# Patient Record
Sex: Male | Born: 1985 | Race: White | Hispanic: No | Marital: Single | Smoking: Former smoker
Health system: Southern US, Community
[De-identification: ages and names within clinical notes are randomized; demographics above are authoritative.]

## PROBLEM LIST (undated history)

## (undated) DIAGNOSIS — F329 Major depressive disorder, single episode, unspecified: Secondary | ICD-10-CM

## (undated) DIAGNOSIS — F419 Anxiety disorder, unspecified: Secondary | ICD-10-CM

## (undated) DIAGNOSIS — K219 Gastro-esophageal reflux disease without esophagitis: Secondary | ICD-10-CM

## (undated) DIAGNOSIS — E7800 Pure hypercholesterolemia, unspecified: Secondary | ICD-10-CM

## (undated) DIAGNOSIS — F410 Panic disorder [episodic paroxysmal anxiety] without agoraphobia: Secondary | ICD-10-CM

## (undated) DIAGNOSIS — F1020 Alcohol dependence, uncomplicated: Secondary | ICD-10-CM

## (undated) DIAGNOSIS — F32A Depression, unspecified: Secondary | ICD-10-CM

## (undated) DIAGNOSIS — I10 Essential (primary) hypertension: Secondary | ICD-10-CM

## (undated) DIAGNOSIS — S42309A Unspecified fracture of shaft of humerus, unspecified arm, initial encounter for closed fracture: Secondary | ICD-10-CM

## (undated) HISTORY — DX: Essential (primary) hypertension: I10

## (undated) HISTORY — PX: WISDOM TOOTH EXTRACTION: SHX21

## (undated) HISTORY — DX: Pure hypercholesterolemia, unspecified: E78.00

## (undated) HISTORY — DX: Gastro-esophageal reflux disease without esophagitis: K21.9

---
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
## 2009-05-12 ENCOUNTER — Encounter: Admission: RE | Admit: 2009-05-12 | Discharge: 2009-05-12 | Payer: Self-pay | Admitting: Family Medicine

## 2012-12-31 ENCOUNTER — Other Ambulatory Visit: Payer: Self-pay | Admitting: Gastroenterology

## 2013-01-07 ENCOUNTER — Ambulatory Visit
Admission: RE | Admit: 2013-01-07 | Discharge: 2013-01-07 | Disposition: A | Discharge status: Home / Self Care | Payer: BC Managed Care – PPO | Source: Ambulatory Visit | Attending: Gastroenterology | Admitting: Gastroenterology

## 2013-01-07 DIAGNOSIS — R110 Nausea: Secondary | ICD-10-CM

## 2013-05-26 ENCOUNTER — Encounter (HOSPITAL_COMMUNITY): Payer: Self-pay | Admitting: Emergency Medicine

## 2013-05-26 ENCOUNTER — Emergency Department (INDEPENDENT_AMBULATORY_CARE_PROVIDER_SITE_OTHER)
Admission: EM | Admit: 2013-05-26 | Discharge: 2013-05-26 | Disposition: A | Discharge status: Home / Self Care | Payer: BC Managed Care – PPO | Source: Home / Self Care

## 2013-05-26 DIAGNOSIS — T24001A Burn of unspecified degree of unspecified site of right lower limb, except ankle and foot, initial encounter: Secondary | ICD-10-CM

## 2013-05-26 DIAGNOSIS — T24009A Burn of unspecified degree of unspecified site of unspecified lower limb, except ankle and foot, initial encounter: Secondary | ICD-10-CM

## 2013-05-26 MED ORDER — CEPHALEXIN 500 MG PO CAPS: 1000.0000 mg | ORAL_CAPSULE | Freq: Two times a day (BID) | ORAL | Status: DC

## 2013-05-26 MED ORDER — SILVER SULFADIAZINE 1 % EX CREA: TOPICAL_CREAM | Freq: Two times a day (BID) | CUTANEOUS | Status: DC

## 2013-05-26 NOTE — ED Provider Notes (Signed)
Medical screening examination/treatment/procedure(s) were performed by resident physician or non-physician practitioner and as supervising physician I was immediately available for consultation/collaboration.   Barkley Bruns MD.   Linna Hoff, MD 05/26/13 2027
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
## 2014-03-12 ENCOUNTER — Encounter (HOSPITAL_COMMUNITY): Payer: Self-pay | Admitting: Emergency Medicine

## 2014-03-12 ENCOUNTER — Emergency Department (HOSPITAL_COMMUNITY)
Admission: EM | Admit: 2014-03-12 | Discharge: 2014-03-12 | Disposition: A | Discharge status: Home / Self Care | Payer: BC Managed Care – PPO | Attending: Emergency Medicine | Admitting: Emergency Medicine

## 2014-03-12 DIAGNOSIS — F419 Anxiety disorder, unspecified: Secondary | ICD-10-CM | POA: Diagnosis present

## 2014-03-12 DIAGNOSIS — Z79899 Other long term (current) drug therapy: Secondary | ICD-10-CM | POA: Insufficient documentation

## 2014-03-12 DIAGNOSIS — F17200 Nicotine dependence, unspecified, uncomplicated: Secondary | ICD-10-CM | POA: Insufficient documentation

## 2014-03-12 DIAGNOSIS — F411 Generalized anxiety disorder: Secondary | ICD-10-CM

## 2014-03-12 DIAGNOSIS — F3289 Other specified depressive episodes: Secondary | ICD-10-CM | POA: Insufficient documentation

## 2014-03-12 DIAGNOSIS — R110 Nausea: Secondary | ICD-10-CM | POA: Insufficient documentation

## 2014-03-12 DIAGNOSIS — F064 Anxiety disorder due to known physiological condition: Secondary | ICD-10-CM | POA: Diagnosis present

## 2014-03-12 DIAGNOSIS — F1010 Alcohol abuse, uncomplicated: Secondary | ICD-10-CM

## 2014-03-12 DIAGNOSIS — F329 Major depressive disorder, single episode, unspecified: Secondary | ICD-10-CM | POA: Insufficient documentation

## 2014-03-12 DIAGNOSIS — Z8781 Personal history of (healed) traumatic fracture: Secondary | ICD-10-CM | POA: Insufficient documentation

## 2014-03-12 DIAGNOSIS — F22 Delusional disorders: Secondary | ICD-10-CM | POA: Insufficient documentation

## 2014-03-12 HISTORY — DX: Panic disorder (episodic paroxysmal anxiety): F41.0

## 2014-03-12 HISTORY — DX: Depression, unspecified: F32.A

## 2014-03-12 HISTORY — DX: Anxiety disorder, unspecified: F41.9

## 2014-03-12 HISTORY — DX: Major depressive disorder, single episode, unspecified: F32.9

## 2014-03-12 HISTORY — DX: Unspecified fracture of shaft of humerus, unspecified arm, initial encounter for closed fracture: S42.309A

## 2014-03-12 LAB — CBC WITH DIFFERENTIAL/PLATELET
Basophils Absolute: 0 10*3/uL (ref 0.0–0.1)
Basophils Relative: 1 % (ref 0–1)
EOS ABS: 0 10*3/uL (ref 0.0–0.7)
Eosinophils Relative: 0 % (ref 0–5)
HCT: 51.2 % (ref 39.0–52.0)
HEMOGLOBIN: 17.9 g/dL — AB (ref 13.0–17.0)
LYMPHS ABS: 2 10*3/uL (ref 0.7–4.0)
LYMPHS PCT: 33 % (ref 12–46)
MCH: 31.6 pg (ref 26.0–34.0)
MCHC: 35 g/dL (ref 30.0–36.0)
MCV: 90.3 fL (ref 78.0–100.0)
MONOS PCT: 7 % (ref 3–12)
Monocytes Absolute: 0.4 10*3/uL (ref 0.1–1.0)
NEUTROS ABS: 3.5 10*3/uL (ref 1.7–7.7)
NEUTROS PCT: 59 % (ref 43–77)
PLATELETS: 294 10*3/uL (ref 150–400)
RBC: 5.67 MIL/uL (ref 4.22–5.81)
RDW: 12.4 % (ref 11.5–15.5)
WBC: 5.9 10*3/uL (ref 4.0–10.5)

## 2014-03-12 LAB — COMPREHENSIVE METABOLIC PANEL
ALK PHOS: 100 U/L (ref 39–117)
ALT: 61 U/L — AB (ref 0–53)
AST: 52 U/L — ABNORMAL HIGH (ref 0–37)
Albumin: 4.8 g/dL (ref 3.5–5.2)
BILIRUBIN TOTAL: 0.9 mg/dL (ref 0.3–1.2)
BUN: 12 mg/dL (ref 6–23)
CHLORIDE: 96 meq/L (ref 96–112)
CO2: 30 mEq/L (ref 19–32)
Calcium: 9.8 mg/dL (ref 8.4–10.5)
Creatinine, Ser: 1 mg/dL (ref 0.50–1.35)
GFR calc non Af Amer: >90 mL/min (ref >90)
GLUCOSE: 122 mg/dL — AB (ref 70–99)
POTASSIUM: 3.9 meq/L (ref 3.7–5.3)
SODIUM: 142 meq/L (ref 137–147)
TOTAL PROTEIN: 8.5 g/dL — AB (ref 6.0–8.3)

## 2014-03-12 LAB — RAPID URINE DRUG SCREEN, HOSP PERFORMED
AMPHETAMINES: NOT DETECTED
BARBITURATES: NOT DETECTED
BENZODIAZEPINES: NOT DETECTED
Cocaine: NOT DETECTED
Opiates: NOT DETECTED
Tetrahydrocannabinol: NOT DETECTED

## 2014-03-12 LAB — ETHANOL: ALCOHOL ETHYL (B): 101 mg/dL — AB (ref 0–11)

## 2014-03-12 MED ORDER — VITAMIN B-1 100 MG PO TABS: 100.0000 mg | ORAL_TABLET | Freq: Every day | ORAL | Status: DC

## 2014-03-12 MED ORDER — NICOTINE 21 MG/24HR TD PT24: 21.0000 mg | MEDICATED_PATCH | Freq: Every day | TRANSDERMAL | Status: DC

## 2014-03-12 MED ORDER — ONDANSETRON HCL 4 MG PO TABS: 4.0000 mg | ORAL_TABLET | Freq: Three times a day (TID) | ORAL | Status: DC | PRN

## 2014-03-12 MED ORDER — LORAZEPAM 1 MG PO TABS: 0.0000 mg | ORAL_TABLET | Freq: Four times a day (QID) | ORAL | Status: DC

## 2014-03-12 MED ORDER — LORAZEPAM 2 MG/ML IJ SOLN: 0.0000 mg | Freq: Two times a day (BID) | INTRAMUSCULAR | Status: DC

## 2014-03-12 MED ORDER — LORAZEPAM 1 MG PO TABS: 1.0000 mg | ORAL_TABLET | Freq: Three times a day (TID) | ORAL | Status: DC | PRN

## 2014-03-12 MED ORDER — ALUM & MAG HYDROXIDE-SIMETH 200-200-20 MG/5ML PO SUSP: 30.0000 mL | ORAL | Status: DC | PRN

## 2014-03-12 MED ORDER — ZOLPIDEM TARTRATE 5 MG PO TABS: 5.0000 mg | ORAL_TABLET | Freq: Every evening | ORAL | Status: DC | PRN

## 2014-03-12 MED ORDER — IBUPROFEN 200 MG PO TABS: 600.0000 mg | ORAL_TABLET | Freq: Three times a day (TID) | ORAL | Status: DC | PRN

## 2014-03-12 MED ORDER — THIAMINE HCL 100 MG/ML IJ SOLN: 100.0000 mg | Freq: Every day | INTRAMUSCULAR | Status: DC

## 2014-03-12 MED ORDER — ACETAMINOPHEN 325 MG PO TABS: 650.0000 mg | ORAL_TABLET | ORAL | Status: DC | PRN

## 2014-03-12 MED ORDER — LORAZEPAM 2 MG/ML IJ SOLN: 0.0000 mg | Freq: Four times a day (QID) | INTRAMUSCULAR | Status: DC

## 2014-03-12 MED ORDER — LORAZEPAM 1 MG PO TABS: 0.0000 mg | ORAL_TABLET | Freq: Two times a day (BID) | ORAL | Status: DC

## 2014-03-12 NOTE — ED Notes (Signed)
Pt requesting detox of ETOH. Per pt's mother he has some mental issues going on, bc he is paranoid all the time, laying out of work, feels like he is going to die.  Pt drinks about 12pk or more beers per day for at least two years.  Pt taking Effexor but sometimes wont take it.  Recently had dosage increased but pt's mother states that pt wont take bc he is afraid to.

## 2014-03-12 NOTE — Progress Notes (Signed)
  CARE MANAGEMENT ED NOTE 03/12/2014  Patient:  Edward Singh,Edward Singh   Account Number:  [id number]  Date Initiated:  03/12/2014  Documentation initiated by:  Edd ArbourGIBBS,Paulanthony Gleaves  Subjective/Objective Assessment:   28 yr old bcbs [place] ppo pt with change of pcp per pt and [facility]     Subjective/Objective Assessment Detail:   c/o detox of ETOH. Per pt's mother he has some mental issues going on, bc he is paranoid all the time, laying out of work, feels like he is going to die.  Pt drinks about 12pk or more beers per day for at least two years.  Pt taking Effexor but sometimes wont take it.     Action/Plan:   spoke with  at pcp office who confirmed pcp is now seen in [place] office Pt now sees brittany hout, PA   Action/Plan Detail:   EPIc updated   Anticipated DC Date:       Status Recommendation to Physician:   Result of Recommendation:    Other ED Services  Consult Working Plan    DC Planning Services  Other  PCP issues  Outpatient Services - Pt will follow up    Choice offered to / List presented to:            Status of service:  Completed, signed off  ED Comments:   ED Comments Detail:

## 2014-03-12 NOTE — Discharge Instructions (Signed)
Panic Attacks  Panic attacks are sudden, short-lived surges of severe anxiety, fear, or discomfort. They may occur for no reason when you are relaxed, when you are anxious, or when you are sleeping. Panic attacks may occur for a number of reasons:   · Healthy people occasionally have panic attacks in extreme, life-threatening situations, such as war or natural disasters. Normal anxiety is a protective mechanism of the body that helps us react to danger (fight or flight response).  · Panic attacks are often seen with anxiety disorders, such as panic disorder, social anxiety disorder, generalized anxiety disorder, and phobias. Anxiety disorders cause excessive or uncontrollable anxiety. They may interfere with your relationships or other life activities.  · Panic attacks are sometimes seen with other mental illnesses such as depression and posttraumatic stress disorder.  · Certain medical conditions, prescription medicines, and drugs of abuse can cause panic attacks.  SYMPTOMS   Panic attacks start suddenly, peak within 20 minutes, and are accompanied by four or more of the following symptoms:  · Pounding heart or fast heart rate (palpitations).  · Sweating.  · Trembling or shaking.  · Shortness of breath or feeling smothered.  · Feeling choked.  · Chest pain or discomfort.  · Nausea or strange feeling in your stomach.  · Dizziness, lightheadedness, or feeling like you will faint.  · Chills or hot flushes.  · Numbness or tingling in your lips or hands and feet.  · Feeling that things are not real or feeling that you are not yourself.  · Fear of losing control or going crazy.  · Fear of dying.  Some of these symptoms can mimic serious medical conditions. For example, you may think you are having a heart attack. Although panic attacks can be very scary, they are not life threatening.  DIAGNOSIS   Panic attacks are diagnosed through an assessment by your health care provider. Your health care provider will ask questions  about your symptoms, such as where and when they occurred. Your health care provider will also ask about your medical history and use of alcohol and drugs, including prescription medicines. Your health care provider may order blood tests or other studies to rule out a serious medical condition. Your health care provider may refer you to a mental health professional for further evaluation.  TREATMENT   · Most healthy people who have one or two panic attacks in an extreme, life-threatening situation will not require treatment.  · The treatment for panic attacks associated with anxiety disorders or other mental illness typically involves counseling with a mental health professional, medicine, or a combination of both. Your health care provider will help determine what treatment is best for you.  · Panic attacks due to physical illness usually goes away with treatment of the illness. If prescription medicine is causing panic attacks, talk with your health care provider about stopping the medicine, decreasing the dose, or substituting another medicine.  · Panic attacks due to alcohol or drug abuse goes away with abstinence. Some adults need professional help in order to stop drinking or using drugs.  HOME CARE INSTRUCTIONS   · Take all your medicines as prescribed.    · Check with your health care provider before starting new prescription or over-the-counter medicines.  · Keep all follow up appointments with your health care provider.  SEEK MEDICAL CARE IF:  · You are not able to take your medicines as prescribed.  · Your symptoms do not improve or get worse.  SEEK IMMEDIATE   MEDICAL CARE IF:   · You experience panic attack symptoms that are different than your usual symptoms.  · You have serious thoughts about hurting yourself or others.  · You are taking medicine for panic attacks and have a serious side effect.  MAKE SURE YOU:  · Understand these instructions.  · Will watch your condition.  · Will get help right away  if you are not doing well or get worse.  Document Released: 10/23/2005 Document Revised: 08/13/2013 Document Reviewed: 06/06/2013  ExitCare® Patient Information ©2014 [employer].
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
## 2014-03-12 NOTE — Progress Notes (Signed)
PA, Lorelle FormosaHanna cancelled consult because the patient is not medically cleared.

## 2014-03-12 NOTE — ED Notes (Signed)
Pt has two personal belongings bags, pt's family states they will take them home.
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
## 2014-03-12 NOTE — ED Notes (Signed)
Bed: [facility] Expected date:  Expected time:  Means of arrival:  Comments: Pt still in room

## 2014-03-16 ENCOUNTER — Emergency Department (HOSPITAL_COMMUNITY)
Admission: EM | Admit: 2014-03-16 | Discharge: 2014-03-16 | Disposition: A | Discharge status: Other Acute Inpatient Hospital | Payer: BC Managed Care – PPO | Attending: Psychiatry | Admitting: Psychiatry

## 2014-03-16 ENCOUNTER — Inpatient Hospital Stay (HOSPITAL_COMMUNITY)
Admission: AD | Admit: 2014-03-16 | Discharge: 2014-03-19 | DRG: 897 | Disposition: A | Discharge status: Home / Self Care | Payer: BC Managed Care – PPO | Source: Intra-hospital | Attending: Psychiatry | Admitting: Psychiatry

## 2014-03-16 ENCOUNTER — Encounter (HOSPITAL_COMMUNITY): Payer: Self-pay | Admitting: Emergency Medicine

## 2014-03-16 ENCOUNTER — Encounter (HOSPITAL_COMMUNITY): Payer: Self-pay

## 2014-03-16 DIAGNOSIS — F1994 Other psychoactive substance use, unspecified with psychoactive substance-induced mood disorder: Secondary | ICD-10-CM | POA: Diagnosis present

## 2014-03-16 DIAGNOSIS — F3289 Other specified depressive episodes: Secondary | ICD-10-CM | POA: Insufficient documentation

## 2014-03-16 DIAGNOSIS — F329 Major depressive disorder, single episode, unspecified: Secondary | ICD-10-CM | POA: Insufficient documentation

## 2014-03-16 DIAGNOSIS — F320 Major depressive disorder, single episode, mild: Secondary | ICD-10-CM | POA: Diagnosis present

## 2014-03-16 DIAGNOSIS — F1020 Alcohol dependence, uncomplicated: Secondary | ICD-10-CM

## 2014-03-16 DIAGNOSIS — F411 Generalized anxiety disorder: Secondary | ICD-10-CM | POA: Diagnosis present

## 2014-03-16 DIAGNOSIS — F410 Panic disorder [episodic paroxysmal anxiety] without agoraphobia: Secondary | ICD-10-CM | POA: Diagnosis present

## 2014-03-16 DIAGNOSIS — Z87891 Personal history of nicotine dependence: Secondary | ICD-10-CM

## 2014-03-16 DIAGNOSIS — F419 Anxiety disorder, unspecified: Secondary | ICD-10-CM

## 2014-03-16 DIAGNOSIS — R109 Unspecified abdominal pain: Secondary | ICD-10-CM | POA: Insufficient documentation

## 2014-03-16 DIAGNOSIS — G4700 Insomnia, unspecified: Secondary | ICD-10-CM | POA: Diagnosis present

## 2014-03-16 DIAGNOSIS — Z8781 Personal history of (healed) traumatic fracture: Secondary | ICD-10-CM | POA: Insufficient documentation

## 2014-03-16 DIAGNOSIS — Z79899 Other long term (current) drug therapy: Secondary | ICD-10-CM | POA: Insufficient documentation

## 2014-03-16 HISTORY — DX: Alcohol dependence, uncomplicated: F10.20

## 2014-03-16 LAB — COMPREHENSIVE METABOLIC PANEL
ALBUMIN: 4.7 g/dL (ref 3.5–5.2)
ALT: 60 U/L — ABNORMAL HIGH (ref 0–53)
AST: 63 U/L — ABNORMAL HIGH (ref 0–37)
Alkaline Phosphatase: 99 U/L (ref 39–117)
BUN: 14 mg/dL (ref 6–23)
CO2: 26 mEq/L (ref 19–32)
Calcium: 9.3 mg/dL (ref 8.4–10.5)
Chloride: 97 mEq/L (ref 96–112)
Creatinine, Ser: 0.96 mg/dL (ref 0.50–1.35)
GFR calc Af Amer: >90 mL/min (ref >90)
GFR calc non Af Amer: >90 mL/min (ref >90)
Glucose, Bld: 126 mg/dL — ABNORMAL HIGH (ref 70–99)
Potassium: 4 mEq/L (ref 3.7–5.3)
Sodium: 140 mEq/L (ref 137–147)
TOTAL PROTEIN: 8.2 g/dL (ref 6.0–8.3)
Total Bilirubin: 1.5 mg/dL — ABNORMAL HIGH (ref 0.3–1.2)

## 2014-03-16 LAB — URINALYSIS, ROUTINE W REFLEX MICROSCOPIC
Bilirubin Urine: NEGATIVE
GLUCOSE, UA: NEGATIVE mg/dL
Hgb urine dipstick: NEGATIVE
Ketones, ur: NEGATIVE mg/dL
LEUKOCYTES UA: NEGATIVE
NITRITE: NEGATIVE
PH: 8 (ref 5.0–8.0)
PROTEIN: 100 mg/dL — AB
Specific Gravity, Urine: 1.035 — ABNORMAL HIGH (ref 1.005–1.030)
Urobilinogen, UA: 1 mg/dL (ref 0.0–1.0)

## 2014-03-16 LAB — CBC WITH DIFFERENTIAL/PLATELET
BASOS PCT: 1 % (ref 0–1)
Basophils Absolute: 0 10*3/uL (ref 0.0–0.1)
EOS PCT: 0 % (ref 0–5)
Eosinophils Absolute: 0 10*3/uL (ref 0.0–0.7)
HCT: 48.6 % (ref 39.0–52.0)
HEMOGLOBIN: 17.3 g/dL — AB (ref 13.0–17.0)
Lymphocytes Relative: 40 % (ref 12–46)
Lymphs Abs: 1.7 10*3/uL (ref 0.7–4.0)
MCH: 31.1 pg (ref 26.0–34.0)
MCHC: 35.6 g/dL (ref 30.0–36.0)
MCV: 87.3 fL (ref 78.0–100.0)
MONO ABS: 0.3 10*3/uL (ref 0.1–1.0)
Monocytes Relative: 6 % (ref 3–12)
Neutro Abs: 2.3 10*3/uL (ref 1.7–7.7)
Neutrophils Relative %: 53 % (ref 43–77)
Platelets: 239 10*3/uL (ref 150–400)
RBC: 5.57 MIL/uL (ref 4.22–5.81)
RDW: 12.1 % (ref 11.5–15.5)
WBC: 4.4 10*3/uL (ref 4.0–10.5)

## 2014-03-16 LAB — ETHANOL: Alcohol, Ethyl (B): 115 mg/dL — ABNORMAL HIGH (ref 0–11)

## 2014-03-16 LAB — URINE MICROSCOPIC-ADD ON

## 2014-03-16 MED ORDER — PANTOPRAZOLE SODIUM 40 MG PO TBEC: 80.0000 mg | DELAYED_RELEASE_TABLET | Freq: Two times a day (BID) | ORAL | Status: DC

## 2014-03-16 MED ORDER — MAGNESIUM HYDROXIDE 400 MG/5ML PO SUSP
30.0000 mL | Freq: Every day | ORAL | Status: DC | PRN
  Administered 2014-03-18: 30 mL via ORAL

## 2014-03-16 MED ORDER — ALUM & MAG HYDROXIDE-SIMETH 200-200-20 MG/5ML PO SUSP: 30.0000 mL | ORAL | Status: DC | PRN

## 2014-03-16 MED ORDER — CHLORDIAZEPOXIDE HCL 25 MG PO CAPS
25.0000 mg | ORAL_CAPSULE | Freq: Four times a day (QID) | ORAL | Status: DC | PRN
  Administered 2014-03-16: 25 mg via ORAL
  Filled 2014-03-16: qty 1

## 2014-03-16 MED ORDER — ONDANSETRON 4 MG PO TBDP: 4.0000 mg | ORAL_TABLET | Freq: Four times a day (QID) | ORAL | Status: DC | PRN

## 2014-03-16 MED ORDER — HYDROXYZINE HCL 25 MG PO TABS
25.0000 mg | ORAL_TABLET | Freq: Four times a day (QID) | ORAL | Status: DC | PRN
  Administered 2014-03-16: 25 mg via ORAL
  Filled 2014-03-16: qty 1

## 2014-03-16 MED ORDER — LOPERAMIDE HCL 2 MG PO CAPS: 2.0000 mg | ORAL_CAPSULE | ORAL | Status: DC | PRN

## 2014-03-16 MED ORDER — LORAZEPAM 1 MG PO TABS: 2.0000 mg | ORAL_TABLET | Freq: Once | ORAL | Status: DC

## 2014-03-16 MED ORDER — THIAMINE HCL 100 MG/ML IJ SOLN: 100.0000 mg | Freq: Once | INTRAMUSCULAR | Status: DC

## 2014-03-16 MED ORDER — LORAZEPAM 1 MG PO TABS: 0.0000 mg | ORAL_TABLET | Freq: Four times a day (QID) | ORAL | Status: DC

## 2014-03-16 MED ORDER — CHLORDIAZEPOXIDE HCL 25 MG PO CAPS
25.0000 mg | ORAL_CAPSULE | Freq: Three times a day (TID) | ORAL | Status: AC
  Administered 2014-03-18 – 2014-03-19 (×3): 25 mg via ORAL
  Filled 2014-03-16 (×3): qty 1

## 2014-03-16 MED ORDER — SODIUM CHLORIDE 0.9 % IV BOLUS (SEPSIS)
1000.0000 mL | INTRAVENOUS | Status: AC
  Administered 2014-03-16: 1000 mL via INTRAVENOUS

## 2014-03-16 MED ORDER — LORAZEPAM 1 MG PO TABS
1.0000 mg | ORAL_TABLET | Freq: Once | ORAL | Status: AC
  Administered 2014-03-16: 1 mg via ORAL
  Filled 2014-03-16: qty 1

## 2014-03-16 MED ORDER — LOPERAMIDE HCL 2 MG PO CAPS
2.0000 mg | ORAL_CAPSULE | ORAL | Status: DC | PRN
  Administered 2014-03-17: 4 mg via ORAL
  Filled 2014-03-16: qty 2

## 2014-03-16 MED ORDER — ONDANSETRON 4 MG PO TBDP
4.0000 mg | ORAL_TABLET | Freq: Once | ORAL | Status: AC
  Administered 2014-03-16: 4 mg via ORAL
  Filled 2014-03-16: qty 1

## 2014-03-16 MED ORDER — VITAMIN B-1 100 MG PO TABS: 100.0000 mg | ORAL_TABLET | Freq: Every day | ORAL | Status: DC

## 2014-03-16 MED ORDER — LORAZEPAM 1 MG PO TABS: 1.0000 mg | ORAL_TABLET | Freq: Four times a day (QID) | ORAL | Status: DC | PRN

## 2014-03-16 MED ORDER — VITAMIN B-1 100 MG PO TABS
100.0000 mg | ORAL_TABLET | Freq: Every day | ORAL | Status: DC
  Filled 2014-03-16: qty 1

## 2014-03-16 MED ORDER — ACETAMINOPHEN 325 MG PO TABS
650.0000 mg | ORAL_TABLET | Freq: Four times a day (QID) | ORAL | Status: DC | PRN
  Administered 2014-03-18: 650 mg via ORAL
  Filled 2014-03-16: qty 2

## 2014-03-16 MED ORDER — ADULT MULTIVITAMIN W/MINERALS CH
1.0000 | ORAL_TABLET | Freq: Every day | ORAL | Status: DC
  Administered 2014-03-17 – 2014-03-19 (×3): 1 via ORAL
  Filled 2014-03-16 (×4): qty 1

## 2014-03-16 MED ORDER — CHLORDIAZEPOXIDE HCL 25 MG PO CAPS: 25.0000 mg | ORAL_CAPSULE | Freq: Once | ORAL | Status: DC

## 2014-03-16 MED ORDER — CHLORDIAZEPOXIDE HCL 25 MG PO CAPS: 25.0000 mg | ORAL_CAPSULE | Freq: Every day | ORAL | Status: DC

## 2014-03-16 MED ORDER — THIAMINE HCL 100 MG/ML IJ SOLN: 100.0000 mg | Freq: Every day | INTRAMUSCULAR | Status: DC

## 2014-03-16 MED ORDER — TRAZODONE HCL 100 MG PO TABS
100.0000 mg | ORAL_TABLET | Freq: Every evening | ORAL | Status: DC | PRN
  Administered 2014-03-16 – 2014-03-18 (×3): 100 mg via ORAL
  Filled 2014-03-16: qty 4
  Filled 2014-03-16 (×3): qty 1

## 2014-03-16 MED ORDER — FOLIC ACID 1 MG PO TABS: 1.0000 mg | ORAL_TABLET | Freq: Every day | ORAL | Status: DC

## 2014-03-16 MED ORDER — THIAMINE HCL 100 MG/ML IJ SOLN
100.0000 mg | Freq: Once | INTRAMUSCULAR | Status: AC
  Administered 2014-03-16: 100 mg via INTRAVENOUS
  Filled 2014-03-16: qty 2

## 2014-03-16 MED ORDER — ADULT MULTIVITAMIN W/MINERALS CH
1.0000 | ORAL_TABLET | Freq: Every day | ORAL | Status: DC
  Administered 2014-03-16: 1 via ORAL
  Filled 2014-03-16 (×2): qty 1

## 2014-03-16 MED ORDER — CHLORDIAZEPOXIDE HCL 25 MG PO CAPS
25.0000 mg | ORAL_CAPSULE | Freq: Four times a day (QID) | ORAL | Status: AC
  Administered 2014-03-16 – 2014-03-18 (×6): 25 mg via ORAL
  Filled 2014-03-16 (×6): qty 1

## 2014-03-16 MED ORDER — VENLAFAXINE HCL ER 37.5 MG PO CP24
37.5000 mg | ORAL_CAPSULE | Freq: Every day | ORAL | Status: DC
  Administered 2014-03-17: 37.5 mg via ORAL
  Filled 2014-03-16 (×2): qty 1

## 2014-03-16 MED ORDER — HYDROXYZINE HCL 25 MG PO TABS
25.0000 mg | ORAL_TABLET | Freq: Four times a day (QID) | ORAL | Status: DC | PRN
  Administered 2014-03-17: 25 mg via ORAL
  Filled 2014-03-16: qty 1

## 2014-03-16 MED ORDER — ADULT MULTIVITAMIN W/MINERALS CH: 1.0000 | ORAL_TABLET | Freq: Every day | ORAL | Status: DC

## 2014-03-16 MED ORDER — CHLORDIAZEPOXIDE HCL 25 MG PO CAPS: 25.0000 mg | ORAL_CAPSULE | ORAL | Status: DC

## 2014-03-16 MED ORDER — VITAMIN B-1 100 MG PO TABS
100.0000 mg | ORAL_TABLET | Freq: Every day | ORAL | Status: DC
  Administered 2014-03-17 – 2014-03-19 (×3): 100 mg via ORAL
  Filled 2014-03-16 (×4): qty 1

## 2014-03-16 MED ORDER — LORAZEPAM 2 MG/ML IJ SOLN: 1.0000 mg | Freq: Four times a day (QID) | INTRAMUSCULAR | Status: DC | PRN

## 2014-03-16 MED ORDER — PANTOPRAZOLE SODIUM 40 MG PO TBEC
80.0000 mg | DELAYED_RELEASE_TABLET | Freq: Two times a day (BID) | ORAL | Status: DC
  Administered 2014-03-17 – 2014-03-19 (×5): 80 mg via ORAL
  Filled 2014-03-16 (×9): qty 2

## 2014-03-16 MED ORDER — VENLAFAXINE HCL ER 37.5 MG PO CP24
37.5000 mg | ORAL_CAPSULE | Freq: Every day | ORAL | Status: DC
  Filled 2014-03-16: qty 1

## 2014-03-16 MED ORDER — LORAZEPAM 1 MG PO TABS: 0.0000 mg | ORAL_TABLET | Freq: Two times a day (BID) | ORAL | Status: DC

## 2014-03-16 NOTE — Progress Notes (Signed)
  CARE MANAGEMENT ED NOTE 03/16/2014  Patient:  Edward Singh,Edward Singh   Account Number:  [id number]  Date Initiated:  03/16/2014  Documentation initiated by:  Edd ArbourGIBBS,KIMBERLY  Subjective/Objective Assessment:   28 yr old [place] pt Hx of anxiety & daily drinking for 4 years last seen in [facility] on 03/12/14 and was scheduled to be seen by outpt psych provider at d/c, 2 ED visits in last 6 months no admission     Subjective/Objective Assessment Detail:   CM consult for help with establishing outpatient rehab for alcohol    Pcp Tanna FurryBrittany Hout     Action/Plan:   CM spoke with ED US. CM spoke with TTS staff Paige. TTS order entered in EPIC   Action/Plan Detail:   CM spoke with pt who confirms access to psychologist at the end of May 2015 CM reviewed available etoh rehab centers covered by [employer] per [employer] web site with pt's father Discuss how to obtain this information via [employer] web site & customer #   Anticipated DC Date:       Status Recommendation to Physician:   Result of Recommendation:    Other ED Services  Consult Working Plan    DC Planning Services  Other  Outpatient Services - Pt will follow up    Choice offered to / List presented to:            Status of service:  Completed, signed off  ED Comments:   ED Comments Detail:  ED CM reviewed the following and provided written information to pt and his father at the bedside-plus other centers in [place]- Discussed ED BH unit inability to control bed availability at these facilities  [facility] [address], [place], [place] [address] [phone]  [facility] [address], [place], [place] [address] [phone]  [facility] [address], [place], [place] [phone]  [facility] [address], [place], [place] [address] [phone]  [facility] [address], [place], [place] [address] [phone]
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
## 2014-03-16 NOTE — ED Notes (Signed)
TTS at bedside. 

## 2014-03-16 NOTE — ED Notes (Signed)
[facility]

## 2014-03-16 NOTE — ED Notes (Signed)
Per family and pt.  Hx of anxiety and daily drinking for 4 years.  Pt drinks daily at approx anywhere from 6-7 up to 14.  Pt also c/o abdominal pain starting today.  Pt also seen Thursday for same.  Pt has been vomiting today.  Last drink last night.
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
## 2014-03-16 NOTE — Tx Team (Signed)
Initial Interdisciplinary Treatment Plan  PATIENT STRENGTHS: (choose at least two) General fund of knowledge Supportive family/friends  PATIENT STRESSORS: Substance abuse   PROBLEM LIST: Problem List/Patient Goals Date to be addressed Date deferred Reason deferred Estimated date of resolution  SA 03/16/14     Anxiety 03/16/14                                                DISCHARGE CRITERIA:  Improved stabilization in mood, thinking, and/or behavior  PRELIMINARY DISCHARGE PLAN: Attend PHP/IOP Outpatient therapy  PATIENT/FAMIILY INVOLVEMENT: This treatment plan has been presented to and reviewed with the patient, Edward Singh.  The patient and family have been given the opportunity to ask questions and make suggestions.  Delos HaringMichael A Aylissa Singh 03/16/2014, 7:25 PM

## 2014-03-17 DIAGNOSIS — F411 Generalized anxiety disorder: Secondary | ICD-10-CM

## 2014-03-17 DIAGNOSIS — F1994 Other psychoactive substance use, unspecified with psychoactive substance-induced mood disorder: Secondary | ICD-10-CM

## 2014-03-17 DIAGNOSIS — F410 Panic disorder [episodic paroxysmal anxiety] without agoraphobia: Secondary | ICD-10-CM

## 2014-03-17 MED ORDER — LISINOPRIL 10 MG PO TABS
10.0000 mg | ORAL_TABLET | Freq: Every day | ORAL | Status: DC
  Administered 2014-03-18 – 2014-03-19 (×2): 10 mg via ORAL
  Filled 2014-03-17: qty 1
  Filled 2014-03-17: qty 2
  Filled 2014-03-17 (×2): qty 1

## 2014-03-17 MED ORDER — LISINOPRIL 20 MG PO TABS
20.0000 mg | ORAL_TABLET | Freq: Once | ORAL | Status: AC
  Administered 2014-03-17: 20 mg via ORAL
  Filled 2014-03-17 (×2): qty 1

## 2014-03-17 NOTE — [facility] Suicide Risk Assessment (Signed)
Suicide Risk Assessment  Admission Assessment     Nursing information obtained from:    Demographic factors:    Current Mental Status:    Loss Factors:    Historical Factors:    Risk Reduction Factors:    Total Time spent with patient: 45 minutes  CLINICAL FACTORS:   Alcohol/Substance Abuse/Dependencies COGNITIVE FEATURES THAT CONTRIBUTE TO RISK:  Closed-mindedness Polarized thinking Thought constriction (tunnel vision)    SUICIDE RISK:   Moderate:   PLAN OF CARE: Supportive approach/coping skills/relapse prevention                               [facility]rium Detox/reassess and address the co morbidities                               D/C Effexor  I certify that inpatient services furnished can reasonably be expected to improve the patient's condition.  Rachael Feerving A Sione Baumgarten 03/17/2014, 5:00 PM

## 2014-03-17 NOTE — Progress Notes (Signed)
Patient ID: Edward ShapeMichael A Singh, male   DOB: 12-Mar-1986, 28 y.o.   MRN: [id number] D: Pt is awake and active on the unit this AM. Pt denies SI/HI and A/V hallucinations. Pt mood is appropriate and his affect is flat. Writer provided literature regarding recovery and encouraged pt and family to explore personal experience as it relates to addiction.   A: Encouraged pt to express needs with staff and administered medication per MD orders. Writer also encouraged pt to participate in groups.  R: Pt and family are receptive to staff guidance and input. Writer will continue to monitor. 15 minute checks are ongoing for safety.

## 2014-03-17 NOTE — ED Provider Notes (Signed)
Medical screening examination/treatment/procedure(s) were performed by non-physician practitioner and as supervising physician I was immediately available for consultation/collaboration.   EKG Interpretation None       Zamara Cozad R. Claudy Abdallah, MD 03/17/14 0031 

## 2014-03-17 NOTE — Progress Notes (Signed)
Patient ID: Edward Singh, male   DOB: 04-04-1986, 28 y.o.   MRN: [id number] He has been up and about and to groups interacting with peers. He denies thoughts of wanting to harm himself. Denies being depressed and is not hopeless.

## 2014-03-17 NOTE — H&P (Signed)
Psychiatric Admission Assessment Adult  Patient Identification:  Edward Singh Date of Evaluation:  03/17/2014 Chief Complaint:  ALCOHOL ABUSE ANXIETY DISORDER NOS History of Present Illness:: 28 Y/o male who states he has been drinking, 6-7 beers daily, when working and up to 16 when not working. Drinking heavily since 23 Smoked a little bit of weed in HS. His drinking is affecting his ability to function. Report no energy no motivation to do anything. He is concerned about his job. When he has tried to stop admits to sweats, nausea, vomiting tremors. Does admit to anxiety, panic. He has been treated with Effexor for years. States he experiences "vertigo" light head ness. He has not taken it every day and recently it was increased to 75 mg. Might have been more foggy headed since then  Associated Signs/Synptoms: Depression Symptoms:  depressed mood, anhedonia, fatigue, anxiety, panic attacks, loss of energy/fatigue, (Hypo) Manic Symptoms:  Denies Anxiety Symptoms:  Excessive Worry, Panic Symptoms, Social Anxiety, Psychotic Symptoms:  Denies PTSD Symptoms: Negative Total Time spent with patient: 45 minutes  Psychiatric Specialty Exam: Physical Exam  Review of Systems  Constitutional: Positive for malaise/fatigue.  HENT: Negative.   Eyes: Negative.   Respiratory: Negative.   Cardiovascular: Positive for palpitations.  Gastrointestinal: Positive for heartburn and nausea.  Genitourinary: Negative.   Musculoskeletal: Negative.   Skin: Negative.   Neurological: Positive for dizziness and weakness.  Endo/Heme/Allergies: Negative.   Psychiatric/Behavioral: Positive for substance abuse. The patient is nervous/anxious and has insomnia.     Blood pressure 150/116, pulse 114, temperature 98 F (36.7 C), temperature source Oral, resp. rate 16, height 5' 9.25" (1.759 m), weight 76.204 kg (168 lb).Body mass index is 24.63 kg/(m^2).  General Appearance: Fairly Groomed  Engineer, water::   Poor  Speech:  Clear and Coherent, Slow and not spontanoues  Volume:  Decreased  Mood:  Anxious and Depressed  Affect:  anxious, worried  Thought Process:  Coherent and Goal Directed  Orientation:  Full (Time, Place, and Person)  Thought Content:  symtpoms, worries, concerns  Suicidal Thoughts:  No  Homicidal Thoughts:  No  Memory:  Immediate;   Fair Recent;   Fair Remote;   Fair  Judgement:  Fair  Insight:  Present  Psychomotor Activity:  Restlessness  Concentration:  Fair  Recall:  [employer] of Knowledge:NA  Language: Fair  Akathisia:  No  Handed:    AIMS (if indicated):     Assets:  Desire for [place]  Sleep:       Musculoskeletal: Strength & Muscle Tone: within normal limits Gait & Station: normal Patient leans: N/A  Past Psychiatric History: Diagnosis:  Hospitalizations: Denies  Outpatient Care: has seen a counselor at church for his drinking  Substance Abuse Care: Denies  Self-Mutilation: denies  Suicidal Attempts:Denies  Violent Behaviors:Denies   Past Medical History:   Past Medical History  Diagnosis Date  . Broken arm   . Panic attacks   . Anxiety   . Depression   . Alcoholism     Allergies:  No Known Allergies PTA Medications: Prescriptions prior to admission  Medication Sig Dispense Refill  . dexlansoprazole (DEXILANT) 60 MG capsule Take 60 mg by mouth every morning.       . venlafaxine XR (EFFEXOR-XR) 37.5 MG 24 hr capsule Take 37.5 mg by mouth daily with breakfast.        Previous Psychotropic Medications:  Medication/Dose    Effexor XR 37. 5 mg increased to 75  mg             Substance Abuse History in the last 12 months:  yes  Consequences of Substance Abuse: Withdrawal Symptoms:   Diaphoresis Diarrhea Nausea Tremors  Social History:  reports that he has quit smoking. His smokeless tobacco use includes Chew. He reports that he drinks alcohol. He reports that he does not  use illicit drugs. 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 Legal History: speeding tickets Hobbies/Interests:  Family History:  History reviewed. No pertinent family history.  Results for orders placed during the hospital encounter of 03/16/14 (from the past 72 hour(s))  CBC WITH DIFFERENTIAL     Status: Abnormal   Collection Time    03/16/14 12:25 PM      Result Value Ref Range   WBC 4.4  4.0 - 10.5 K/uL   RBC 5.57  4.22 - 5.81 MIL/uL   Hemoglobin 17.3 (*) 13.0 - 17.0 g/dL   HCT 48.6  39.0 - 52.0 %   MCV 87.3  78.0 - 100.0 fL   MCH 31.1  26.0 - 34.0 pg   MCHC 35.6  30.0 - 36.0 g/dL   RDW 12.1  11.5 - 15.5 %   Platelets 239  150 - 400 K/uL   Neutrophils Relative % 53  43 - 77 %   Neutro Abs 2.3  1.7 - 7.7 K/uL   Lymphocytes Relative 40  12 - 46 %   Lymphs Abs 1.7  0.7 - 4.0 K/uL   Monocytes Relative 6  3 - 12 %   Monocytes Absolute 0.3  0.1 - 1.0 K/uL   Eosinophils Relative 0  0 - 5 %   Eosinophils Absolute 0.0  0.0 - 0.7 K/uL   Basophils Relative 1  0 - 1 %   Basophils Absolute 0.0  0.0 - 0.1 K/uL  COMPREHENSIVE METABOLIC PANEL     Status: Abnormal   Collection Time    03/16/14 12:25 PM      Result Value Ref Range   Sodium 140  137 - 147 mEq/L   Potassium 4.0  3.7 - 5.3 mEq/L   Chloride 97  96 - 112 mEq/L   CO2 26  19 - 32 mEq/L   Glucose, Bld 126 (*) 70 - 99 mg/dL   BUN 14  6 - 23 mg/dL   Creatinine, Ser 0.96  0.50 - 1.35 mg/dL   Calcium 9.3  8.4 - 10.5 mg/dL   Total Protein 8.2  6.0 - 8.3 g/dL   Albumin 4.7  3.5 - 5.2 g/dL    AST 63 (*) 0 - 37 U/L   ALT 60 (*) 0 - 53 U/L   Alkaline Phosphatase 99  39 - 117 U/L   Total Bilirubin 1.5 (*) 0.3 - 1.2 mg/dL   GFR calc non Af Amer >90  >90 mL/min   GFR calc Af Amer >90  >90 mL/min   Comment: (NOTE)     The eGFR has been calculated using the CKD EPI equation.     This calculation has not been validated in  all clinical situations.     eGFR's persistently <90 mL/min signify possible Chronic Kidney     Disease.  ETHANOL     Status: Abnormal   Collection Time    03/16/14 12:25 PM      Result Value Ref Range   Alcohol, Ethyl (B) 115 (*) 0 - 11 mg/dL   Comment:            LOWEST DETECTABLE LIMIT FOR     SERUM ALCOHOL IS 11 mg/dL     FOR MEDICAL PURPOSES ONLY  URINALYSIS, ROUTINE W REFLEX MICROSCOPIC     Status: Abnormal   Collection Time    03/16/14 12:46 PM      Result Value Ref Range   Color, Urine AMBER (*) YELLOW   Comment: BIOCHEMICALS MAY BE AFFECTED BY COLOR   APPearance CLOUDY (*) CLEAR   Specific Gravity, Urine 1.035 (*) 1.005 - 1.030   pH 8.0  5.0 - 8.0   Glucose, UA NEGATIVE  NEGATIVE mg/dL   Hgb urine dipstick NEGATIVE  NEGATIVE   Bilirubin Urine NEGATIVE  NEGATIVE   Ketones, ur NEGATIVE  NEGATIVE mg/dL   Protein, ur 100 (*) NEGATIVE mg/dL   Urobilinogen, UA 1.0  0.0 - 1.0 mg/dL   Nitrite NEGATIVE  NEGATIVE   Leukocytes, UA NEGATIVE  NEGATIVE  URINE MICROSCOPIC-ADD ON     Status: None   Collection Time    03/16/14 12:46 PM      Result Value Ref Range   Urine-Other MUCOUS PRESENT     Comment: AMORPHOUS URATES/PHOSPHATES   Psychological Evaluations:  Assessment:   DSM5:  Schizophrenia Disorders:  none Obsessive-Compulsive Disorders:  none Trauma-Stressor Disorders:  none Substance/Addictive Disorders:  Alcohol Related Disorder - Severe (303.90) Depressive Disorders:  Major Depressive Disorder - Moderate (296.22)  AXIS I:  Generalized Anxiety Disorder, Panic attacks Substance Induced Mood Disorder AXIS II:  No diagnosis AXIS III:    Past Medical History  Diagnosis Date  . Broken arm   . Panic attacks   . Anxiety   . Depression   . Alcoholism    AXIS IV:  other psychosocial or environmental problems AXIS V:  51-60 moderate symptoms  Treatment Plan/Recommendations:  Supportive approach/coping skills/relapse prevention                                                                 Librium detox protocol                                                                 Reassess and address the co morbidities                                                                 D/C the Effexor  Monitor BP                                                                   Treatment Plan Summary: Daily contact with patient to assess and evaluate symptoms and progress in treatment Medication management Current Medications:  Current Facility-Administered Medications  Medication Dose Route Frequency Provider Last Rate Last Dose  . acetaminophen (TYLENOL) tablet 650 mg  650 mg Oral Q6H PRN Waylan Boga, NP      . alum & mag hydroxide-simeth (MAALOX/MYLANTA) 200-200-20 MG/5ML suspension 30 mL  30 mL Oral Q4H PRN Waylan Boga, NP      . chlordiazePOXIDE (LIBRIUM) capsule 25 mg  25 mg Oral Q6H PRN Kathlee Nations, MD   25 mg at 03/16/14 2019  . chlordiazePOXIDE (LIBRIUM) capsule 25 mg  25 mg Oral QID Kathlee Nations, MD   25 mg at 03/17/14 0755   Followed by  . [START ON 03/18/2014] chlordiazePOXIDE (LIBRIUM) capsule 25 mg  25 mg Oral TID Kathlee Nations, MD       Followed by  . [START ON 03/19/2014] chlordiazePOXIDE (LIBRIUM) capsule 25 mg  25 mg Oral BH-qamhs Kathlee Nations, MD       Followed by  . [START ON 03/21/2014] chlordiazePOXIDE (LIBRIUM) capsule 25 mg  25 mg Oral Daily Kathlee Nations, MD      . hydrOXYzine (ATARAX/VISTARIL) tablet 25 mg  25 mg Oral Q6H PRN Kathlee Nations, MD   25 mg at 03/17/14 4008  . [START ON 03/18/2014] lisinopril (PRINIVIL,ZESTRIL) tablet 10 mg  10  mg Oral Daily Encarnacion Slates, NP      . loperamide (IMODIUM) capsule 2-4 mg  2-4 mg Oral PRN Kathlee Nations, MD      . magnesium hydroxide (MILK OF MAGNESIA) suspension 30 mL  30 mL Oral Daily PRN Waylan Boga, NP      . multivitamin with minerals tablet 1 tablet  1 tablet Oral Daily Kathlee Nations, MD   1 tablet at 03/17/14 0755  . ondansetron (ZOFRAN-ODT) disintegrating tablet 4 mg  4 mg Oral Q6H PRN Kathlee Nations, MD      . pantoprazole (PROTONIX) EC tablet 80 mg  80 mg Oral BID AC Waylan Boga, NP   80 mg at 03/17/14 6761  . thiamine (B-1) injection 100 mg  100 mg Intramuscular Once Kathlee Nations, MD      . thiamine (VITAMIN B-1) tablet 100 mg  100 mg Oral Daily Kathlee Nations, MD   100 mg at 03/17/14 0755  . traZODone (DESYREL) tablet 100 mg  100 mg Oral QHS PRN Waylan Boga, NP   100 mg at 03/16/14 2146  . venlafaxine XR (EFFEXOR-XR) 24 hr capsule 37.5 mg  37.5 mg Oral Q breakfast Waylan Boga, NP   37.5 mg at 03/17/14 0755    Observation Level/Precautions:  15 minute checks  Laboratory:  As per the ED  Psychotherapy:  Individual/group  Medications:  Librium Detox protocol/D/C the Effexor  Consultations:    Discharge Concerns:    Estimated LOS: 3-5 days  Other:     I certify that inpatient services furnished can reasonably be expected to improve the patient's condition.   Nicholaus Bloom 5/12/20158:42 AM

## 2014-03-17 NOTE — Progress Notes (Addendum)
Pt attended spiritual care group on grief and loss facilitated by chaplain Burnis KingfisherMatthew Novalie Singh.  Group opened with brief discussion and psycho-social ed around grief and loss in relationships and in relation to self - identifying life patterns, circumstances, changes that cause losses. Established group norm of speaking from own life experience. Group goal of establishing open and affirming space for members to share loss and experience with grief, normalize grief experience and provide psycho social education and grief support.    Edward Singh was present throughout group.  He was not vocal.  He was somewhat restless in group, though appeared to be attentive to others as they shared.    Edward MealyMatthew W Charron Coultas MDiv

## 2014-03-17 NOTE — Consult Note (Signed)
Face to face evaluation and I agree with this note 

## 2014-03-17 NOTE — [facility] Suicide Risk Assessment (Signed)
[facility] INPATIENT: Family/Significant Other Suicide Prevention Education  Suicide Prevention Education:  Education Completed; No one has been identified by the patient as the family member/significant other with whom the patient will be residing, and identified as the person(s) who will aid the patient in the event of a mental health crisis (suicidal ideations/suicide attempt).   Pt did not c/o SI at admission, nor have they endorsed SI during their stay here. SPE not required. SPI pamphlet provided to pt and he was encouraged to share information with support network, ask questions, and talk about any concerns relating to SPE.   The Sherwin-WilliamsHeather Smart, LCSWA 03/17/2014 9:39 AM

## 2014-03-17 NOTE — [facility] Group Notes (Signed)
[facility] LCSW Group Therapy  03/17/2014 3:19 PM  Type of Therapy:  Group Therapy  Participation Level:  Active  Participation Quality:  Attentive  Affect:  Appropriate  Cognitive:  Alert and Oriented  Insight:  Engaged  Engagement in Therapy:  Engaged  Modes of Intervention:  Confrontation, Discussion, Education, Exploration, Rapport Building, Dance movement psychotherapisteality Testing, Socialization and Support  Summary of Progress/Problems: [facility] Speaker came to talk about his personal journey with substance abuse and addiction. The pt processed ways by which to relate to the speaker. MHA speaker provided handouts and educational information pertaining to groups and services offered by the [facility]. Edward Singh was attentive and engaged throughout today's therapy group. He actively listened as speaker shared his personal story and reviewed resources [facility] by [facility].    Edward Singh LCSWA  03/17/2014, 3:19 PM

## 2014-03-17 NOTE — [facility] Group Notes (Signed)
[facility] Group Notes:  (Nursing/MHT/Case Management/Adjunct)  Date:  03/17/2014  Time: 0900 am  Type of Therapy:  Psychoeducational Skills  Participation Level:  Did Not Attend   Cranford MonCaroline Evans Laraine Samet 03/17/2014, 5:57 PM

## 2014-03-17 NOTE — [facility] Counselor (Signed)
Adult Comprehensive Assessment  Patient ID: Edward Singh, male   DOB: 04-Aug-1986, 28 y.o.   MRN: [id number]  Information Source: Information source: Patient  Current Stressors:  Physical health (include injuries & life threatening diseases): prehypertension Bereavement / Loss: none identified   Living/Environment/Situation:  Living Arrangements: Spouse/significant other;Parent Living conditions (as described by patient or guardian): I live in a house with my parents and girlfriend. supportive, clean and safe How long has patient lived in current situation?: few months  What is atmosphere in current home: Comfortable;Loving;Supportive  Family History:  Marital status: Single (dating for 3 /12 years ) Does patient have children?: No  Childhood History:  By whom was/is the patient raised?: Both parents Additional childhood history information: My parents are married. They raised me when I was younger.  Description of patient's relationship with caregiver when they were a child: Close to both parents as a child. I had a great childhood Patient's description of current relationship with people who raised him/her: I'm still really close to my parents.  Does patient have siblings?: Yes Number of Siblings: 1 Description of patient's current relationship with siblings: Good relationship with younger sister. We are a tight knit family.  Did patient suffer any verbal/emotional/physical/sexual abuse as a child?: No Did patient suffer from severe childhood neglect?: No Has patient ever been sexually abused/assaulted/raped as an adolescent or adult?: No Was the patient ever a victim of a crime or a disaster?: No Witnessed domestic violence?: No Has patient been effected by domestic violence as an adult?: No  Education:  Highest grade of school patient has completed: 2 year degree college. 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  How long has patient been employed?: 7 years  Patient's job has been impacted by current illness: Yes Describe how patient's job has been impacted: Drinking and feeling hungover in the morning. Sometimes I was late or missed work.  What is the longest time patient has a held a job?: 7 years Where was the patient employed at that time?: see above.  Has patient ever been in the Eli Lilly and Companymilitary?: No Has patient ever served in combat?: No  Financial Resources:   Financial resources: Income from [employer] Insuranceemployment;Private insurance Does patient have a representative payee or guardian?: No  Alcohol/Substance Abuse:   What has been your use of drugs/alcohol within the last 12 months?: I drink alot of beer/liquor on weekends. I drink about 14 beers daily. No drug use identified. I've been drinking for about four years.  If attempted suicide, did drugs/alcohol play a role in this?: No Alcohol/Substance Abuse Treatment Hx: Past Tx, Outpatient If yes, describe treatment: PCP prescribes my mental health meds. I've never been to detox or tx before.  Has alcohol/substance abuse ever caused legal problems?: No  Social Support System:   Patient's Community Support System: Good Describe Community Support System: I have a good group of friends. They drink with me sometimes but don't have a problem i don't think.  Type of faith/religion: Ephriam KnucklesChristian How does patient's faith help to cope with current illness?: I go to church and have a good church Manufacturing systems engineercommunity  Leisure/Recreation:   Leisure and Hobbies: hunting and fishing.   Strengths/Needs:   What things does the patient do well?: hunt, fish, hard worker, good person In what areas does patient struggle /  problems for patient: struggling with drinking habits/coping with stuff.   Discharge Plan:   Does  patient have access to transportation?: Yes (license and car) Will patient be returning to same living situation after discharge?: Yes Currently receiving community mental health services: Yes (From Whom) ([facility]) If no, would patient like referral for services when discharged?: Yes (What county?) Medical sales representative([place]) Does patient have financial barriers related to discharge medications?: No  Summary/Recommendations:    Pt is 28 year old male [place] [facility] with his parents and gf. [facility], mood stabilization, and med management. Pt denied SI/HI/AVH upon admission. Recommendations for pt include: crisis stabilization, therapeutic milieu, encourage group attendance and participation, librium taper for withdrawals, medication management for mood stabilization, and development of comprehensive mental wellness/sobriety plan. Pt plans to return home at d/c and followup at [facility] for SA IOP evening groups, med management and possibly individual therapy.   Edward Singh LCSWA. 03/17/2014

## 2014-03-17 NOTE — ED Provider Notes (Signed)
Medical screening examination/treatment/procedure(s) were performed by non-physician practitioner and as supervising physician I was immediately available for consultation/collaboration.   EKG Interpretation None        Laray AngerKathleen M Johnwesley Lederman, DO 03/17/14 1435

## 2014-03-18 DIAGNOSIS — F4010 Social phobia, unspecified: Secondary | ICD-10-CM

## 2014-03-18 MED ORDER — FLUOXETINE HCL 10 MG PO CAPS
10.0000 mg | ORAL_CAPSULE | Freq: Every day | ORAL | Status: DC
  Administered 2014-03-18 – 2014-03-19 (×2): 10 mg via ORAL
  Filled 2014-03-18 (×4): qty 1

## 2014-03-18 NOTE — Tx Team (Signed)
Interdisciplinary Treatment Plan Update (Adult)  Date: 03/18/2014  Time Reviewed:  9:45 AM  Progress in Treatment: Attending groups: Yes Participating in groups:  Yes Taking medication as prescribed:  Yes Tolerating medication:  Yes Family/Significant othe contact made: No, N/A Patient understands diagnosis:  Yes Discussing patient identified problems/goals with staff:  Yes Medical problems stabilized or resolved:  Yes Denies suicidal/homicidal ideation: Yes Issues/concerns per patient self-inventory:  Yes Other:  New problem(s) identified: N/A  Discharge Plan or Barriers: Pt has follow up scheduled at [facility] for outpatient medication management and therapy.    Reason for Continuation of Hospitalization: Anxiety Depression Detox Medication Stabilization  Comments: N/A  Estimated length of stay: 1 day, d/c tomorrow  For review of initial/current patient goals, please see plan of care.  Attendees: Patient:     Family:     Physician:  Dr. Dub MikesLugo 03/18/2014 10:54 AM   Nursing:   Roswell Minersonna Shimp, RN 03/18/2014 10:54 AM   Clinical Social Worker:  Reyes Ivanhelsea Horton, LCSW 03/18/2014 10:54 AM   Other: Onnie BoerJennifer Clark, RN case manager 03/18/2014 10:54 AM   Other:  Joslyn Devonaroline Beaudry, RN 03/18/2014 10:54 AM   Other:  Serena ColonelAggie Nwoko, NP 03/18/2014 10:54 AM   Other:     Other:    Other:    Other:    Other:    Other:    Other:     Scribe for Treatment Team:   Carmina MillerHorton, Feliberto Stockley Nicole, 03/18/2014 , 10:54 AM

## 2014-03-18 NOTE — Progress Notes (Signed)
Patient ID: Edward ShapeMichael A Nardelli, male   DOB: 12-29-1985, 28 y.o.   MRN: [id number] He has been up and about and to groups interacting with peers and staff.  Has requested and received prn of tylenol for aback pain from sleeping wrong on the bed,per him,.

## 2014-03-18 NOTE — Progress Notes (Signed)
Patient ID: [name], male   DOB: 12/04/1985, 28 y.o.   MRN: [id number]   D: Pt informed the writer that he is planning to be discharge tomorrow. Stated he couldn't remember the name of the place, but stated it is [address] Stated his mom is scheduled to be here for pick up at 11:00. Also plans to attend AA meetings at the [facility].  A:  Support and encouragement was offered. 15 min checks continued for safety.  R: Pt remains safe.

## 2014-03-18 NOTE — Progress Notes (Signed)
[facility]  03/18/2014 2:14 PM Edward ShapeMichael A Singh  MRN:  [id number] Subjective: Edward NeedleMichael is still having some symptoms suggestive of Effexor Withdrawal. He is also comming off the alcohol. States he is committed after being told about his liver enzymes to quit drinking for good. He states his GF does not drink. He is committed to that relationship as well as to his job.  Diagnosis:   DSM5: Schizophrenia Disorders:  none Obsessive-Compulsive Disorders:  none Trauma-Stressor Disorders:  none Substance/Addictive Disorders:  Alcohol Related Disorder - Severe (303.90) Depressive Disorders:  Major Depressive Disorder - Mild (296.21) Total Time spent with patient: 30 minutes  Axis I: Generalized Anxiety Disorder and Social Anxiety  ADL's:  Intact  Sleep: Fair  Appetite:  Fair  Suicidal Ideation:  Plan:  denies Intent:  denies Means:  denies Homicidal Ideation:  Plan:  denies Intent:  denies Means:  denies AEB (as evidenced by):  Psychiatric Specialty Exam: Physical Exam  Review of Systems  Constitutional: Negative.   HENT: Negative.   Eyes: Negative.   Respiratory: Negative.   Cardiovascular: Negative.   Gastrointestinal: Negative.   Genitourinary: Negative.   Musculoskeletal: Positive for back pain.  Skin: Negative.   Neurological: Negative.   Endo/Heme/Allergies: Negative.   Psychiatric/Behavioral: Positive for substance abuse. The patient is nervous/anxious and has insomnia.     Blood pressure 122/85, pulse 101, temperature 97.4 F (36.3 C), temperature source Oral, resp. rate 18, height 5' 9.25" (1.759 m), weight 76.204 kg (168 lb).Body mass index is 24.63 kg/(m^2).  General Appearance: Fairly Groomed  Patent attorneyye Contact::  Fair  Speech:  Clear and Coherent and not spontaneous  Volume:  Decreased  Mood:  Anxious and worried  Affect:  Restricted  Thought Process:  Coherent and Goal Directed  Orientation:  Full (Time, Place, and Person)  Thought Content:   symptoms, worries, concerns  Suicidal Thoughts:  No  Homicidal Thoughts:  No  Memory:  Immediate;   Fair Recent;   Fair Remote;   Fair  Judgement:  Fair  Insight:  Present  Psychomotor Activity:  Restlessness  Concentration:  Fair  Recall:  FiservFair  Fund of Knowledge:Fair  Language: Fair  Akathisia:  No  Handed:    AIMS (if indicated):     Assets:  Desire for Improvement  Sleep:  Number of Hours: 6.25   Musculoskeletal: Strength & Muscle Tone: within normal limits Gait & Station: normal Patient leans: N/A  Current Medications: Current Facility-Administered Medications  Medication Dose Route Frequency Provider Last Rate Last Dose  . acetaminophen (TYLENOL) tablet 650 mg  650 mg Oral Q6H PRN Nanine MeansJamison Lord, NP   650 mg at 03/18/14 1106  . alum & mag hydroxide-simeth (MAALOX/MYLANTA) 200-200-20 MG/5ML suspension 30 mL  30 mL Oral Q4H PRN Nanine MeansJamison Lord, NP      . chlordiazePOXIDE (LIBRIUM) capsule 25 mg  25 mg Oral Q6H PRN Cleotis NipperSyed T Arfeen, MD   25 mg at 03/16/14 2019  . chlordiazePOXIDE (LIBRIUM) capsule 25 mg  25 mg Oral TID Cleotis NipperSyed T Arfeen, MD   25 mg at 03/18/14 1106   Followed by  . [START ON 03/19/2014] chlordiazePOXIDE (LIBRIUM) capsule 25 mg  25 mg Oral BH-qamhs Cleotis NipperSyed T Arfeen, MD       Followed by  . [START ON 03/21/2014] chlordiazePOXIDE (LIBRIUM) capsule 25 mg  25 mg Oral Daily Cleotis NipperSyed T Arfeen, MD      . FLUoxetine (PROZAC) capsule 10 mg  10 mg Oral Daily Rachael FeeIrving A Trenisha Lafavor, MD  10 mg at 03/18/14 1107  . hydrOXYzine (ATARAX/VISTARIL) tablet 25 mg  25 mg Oral Q6H PRN Cleotis NipperSyed T Arfeen, MD   25 mg at 03/17/14 [id number]  . lisinopril (PRINIVIL,ZESTRIL) tablet 10 mg  10 mg Oral Daily Sanjuana KavaAgnes I Nwoko, NP   10 mg at 03/18/14 0756  . loperamide (IMODIUM) capsule 2-4 mg  2-4 mg Oral PRN Cleotis NipperSyed T Arfeen, MD   4 mg at 03/17/14 2104  . magnesium hydroxide (MILK OF MAGNESIA) suspension 30 mL  30 mL Oral Daily PRN Nanine MeansJamison Lord, NP      . multivitamin with minerals tablet 1 tablet  1 tablet Oral Daily Cleotis NipperSyed T Arfeen,  MD   1 tablet at 03/18/14 0758  . ondansetron (ZOFRAN-ODT) disintegrating tablet 4 mg  4 mg Oral Q6H PRN Cleotis NipperSyed T Arfeen, MD      . pantoprazole (PROTONIX) EC tablet 80 mg  80 mg Oral BID AC Nanine MeansJamison Lord, NP   80 mg at 03/18/14 [id number]  . thiamine (B-1) injection 100 mg  100 mg Intramuscular Once Cleotis NipperSyed T Arfeen, MD      . thiamine (VITAMIN B-1) tablet 100 mg  100 mg Oral Daily Cleotis NipperSyed T Arfeen, MD   100 mg at 03/18/14 0758  . traZODone (DESYREL) tablet 100 mg  100 mg Oral QHS PRN Nanine MeansJamison Lord, NP   100 mg at 03/17/14 2255    Lab Results: No results found for this or any previous visit (from the past 48 hour(s)).  Physical Findings: AIMS: Facial and Oral Movements Muscles of Facial Expression: None, normal Lips and Perioral Area: None, normal Jaw: None, normal Tongue: None, normal,Extremity Movements Upper (arms, wrists, hands, fingers): None, normal Lower (legs, knees, ankles, toes): None, normal, Trunk Movements Neck, shoulders, hips: None, normal, Overall Severity Severity of abnormal movements (highest score from questions above): None, normal Incapacitation due to abnormal movements: None, normal Patient's awareness of abnormal movements (rate only patient's report): No Awareness, Dental Status Current problems with teeth and/or dentures?: No Does patient usually wear dentures?: No  CIWA:  CIWA-Ar Total: 0 COWS:  COWS Total Score: 9  Treatment Plan Summary: Daily contact with patient to assess and evaluate symptoms and progress in treatment Medication management  Plan: Supportive approach/copign skills/relapse prevention           Complete Librium detox           CBT/miondfulness to handle the anxiety            Add Prozac 10 mg to help with the Effexor withdrawal  Medical Decision Making Problem Points:  Established problem, worsening (2) and Review of psycho-social stressors (1) Data Points:  Review of medication regiment & side effects (2) Review of new medications or change in  dosage (2)  I certify that inpatient services furnished can reasonably be expected to improve the patient's condition.   Rachael Feerving A [place] 03/18/2014, 2:14 PM

## 2014-03-18 NOTE — [facility] Group Notes (Signed)
[facility]   03/18/2014 8:45 AM  Participation Quality:  Alert, Appropriate and Oriented  Mood/Affect:  Calm  Depression Rating:  2  Anxiety Rating:  2  Thoughts of Suicide:  Pt denies SI/HI  Will you contract for safety?   Yes  Current AVH:  Pt denies  Plan for Discharge/Comments:  Pt attended discharge planning group and actively participated in group.  CSW provided pt with today's workbook.  Pt reports having a stomach ache today but otherwise feeling okay.  Pt will return home in [place] and follow up at [facility] for outpatient medication management and therapy.  No further needs voiced by pt at this time.    Transportation Means: Pt reports access to transportation - mom will pick pt up  Supports: No supports mentioned at this time  Edward IvanChelsea Horton, LCSW 03/18/2014 9:20 AM

## 2014-03-18 NOTE — [facility] Group Notes (Signed)
[facility] LCSW Group Therapy  03/18/2014 2:56 PM  Type of Therapy:  Group Therapy  Participation Level:  Minimal  Participation Quality:  Attentive  Affect:  Flat  Cognitive:  Alert  Insight:  Limited  Engagement in Therapy:  Limited  Modes of Intervention:  Confrontation, Discussion, Education, Exploration, Problem-solving, Rapport Building, Socialization and Support  Summary of Progress/Problems: Emotion Regulation: This group focused on both positive and negative emotion identification and allowed group members to process ways to identify feelings, regulate negative emotions, and find healthy ways to manage internal/external emotions. Group members were asked to reflect on a time when their reaction to an emotion led to a negative outcome and explored how alternative responses using emotion regulation would have benefited them. Group members were also asked to discuss a time when emotion regulation was utilized when a negative emotion was experienced. Edward Singh was attentive throughout today's therapy group and actively listened as others participated in group discussion. He did not participate in discussion and presented with anxious affect. Edward Singh shared that he was d/cing tomorrow and was excited to get home. Edward Singh shows some progress in the group setting but struggles with active participation at this time.    Edward Singh LCSWA  03/18/2014, 2:56 PM

## 2014-03-19 DIAGNOSIS — F1020 Alcohol dependence, uncomplicated: Principal | ICD-10-CM

## 2014-03-19 MED ORDER — FLUOXETINE HCL 10 MG PO CAPS: 10.0000 mg | ORAL_CAPSULE | Freq: Every day | ORAL | Status: DC

## 2014-03-19 MED ORDER — LISINOPRIL 10 MG PO TABS: 10.0000 mg | ORAL_TABLET | Freq: Every day | ORAL | Status: DC

## 2014-03-19 MED ORDER — TRAZODONE HCL 100 MG PO TABS: 100.0000 mg | ORAL_TABLET | Freq: Every evening | ORAL | Status: DC | PRN

## 2014-03-19 NOTE — Progress Notes (Signed)
Patient ID: Edward Singh, male   DOB: 1986-06-25, 28 y.o.   MRN: [id number] Late entry for 11:15. He has been discharged home and was picked up by his mother. He voiced understanding of discharge instruction and of follow up plan. He denies SI thoughts and all his belongs were taking home.

## 2014-03-19 NOTE — Discharge Summary (Signed)
Physician Discharge Summary Note  Patient:  Edward Singh is an 28 y.o., male MRN:  [id number] DOB:  1986/08/09 Patient phone:  [phone] (home)  Patient address:   [place] [address],  Total Time spent with patient: Greater than 30 minutes  Date of Admission:  03/16/2014  Date of Discharge: 03/19/14  Reason for Admission:  Alcohol detox  Discharge Diagnoses: Active Problems:   Alcohol dependence   Psychiatric Specialty Exam: Physical Exam  Psychiatric: His speech is normal and behavior is normal. Judgment and thought content normal. His mood appears not anxious. His affect is not angry, not blunt, not labile and not inappropriate. Cognition and memory are normal. He does not exhibit a depressed mood.    Review of Systems  Constitutional: Negative.   HENT: Negative.   Eyes: Negative.   Respiratory: Negative.   Cardiovascular: Negative.   Gastrointestinal: Negative.   Genitourinary: Negative.   Musculoskeletal: Negative.   Skin: Negative.   Neurological: Negative.   Endo/Heme/Allergies: Negative.   Psychiatric/Behavioral: Positive for depression (Stabilized with medication prior to discharge) and substance abuse (Alcohol dependence). Negative for suicidal ideas, hallucinations and memory loss. The patient is nervous/anxious and has insomnia (Stabilized with medication prior to discharge).     Blood pressure 145/86, pulse 96, temperature 98.2 F (36.8 C), temperature source Oral, resp. rate 19, height 5' 9.25" (1.759 m), weight 76.204 kg (168 lb).Body mass index is 24.63 kg/(m^2).   General Appearance: Fairly Groomed   Engineer, water:: Fair   Speech: Clear and Coherent   Volume: Normal   Mood: Euthymic   Affect: Appropriate   Thought Process: Coherent and Goal Directed   Orientation: Full (Time, Place, and Person)   Thought Content: plans as he moves forward, relapse prevention plan   Suicidal Thoughts: No   Homicidal Thoughts: No   Memory:  Immediate; Fair  Recent; Fair  Remote; Fair   Judgement: Fair   Insight: Present   Psychomotor Activity: Normal   Concentration: Fair   Recall: [employer] of Knowledge:NA   Language: Fair   Akathisia: No   Handed: Right   AIMS (if indicated):   Assets: Desire for Improvement  Housing  Leisure Time  Social Support  Talents/Skills  Transportation   Sleep: Number of Hours: 6.75    Past Psychiatric History: Diagnosis: Alcohol dependence, Generalized anxiety disorder  Hospitalizations: [place] adult unit  Outpatient Care:  [facility]  Substance Abuse Care: [facility]  Self-Mutilation: NA  Suicidal Attempts: NA  Violent Behaviors: NA   Musculoskeletal: Strength & Muscle Tone: within normal limits Gait & Station: normal Patient leans: N/A  DSM5: Schizophrenia Disorders:  NA Obsessive-Compulsive Disorders:  NA Trauma-Stressor Disorders:  NA Substance/Addictive Disorders:  Alcohol Related Disorder - Severe (303.90) Depressive Disorders:  NA  Axis Diagnosis:   AXIS I:  Alcohol dependence AXIS II:  Deferred AXIS III:   Past Medical History  Diagnosis Date  . Broken arm   . Panic attacks   . Anxiety   . Depression   . Alcoholism    AXIS IV:  other psychosocial or environmental problems and Alcoholism, chronic AXIS V:  63  Level of Care:  OP  Hospital Course:   28 Y/o male who states he has been drinking, 6-7 beers daily, when working and up to 16 when not working. Drinking heavily since 23 Smoked a little bit of weed in HS. His drinking is affecting his ability to function. Report no energy no motivation to  do anything. He is concerned about his job. When he has tried to stop admits to sweats, nausea, vomiting tremors. Does admit to anxiety, panic. He has been treated with Effexor for years. States he experiences "vertigo" light head ness. He has not taken it every day and recently it was increased to 75 mg. Might have been more foggy headed since then.  Kelcey  was admitted to the hospital with a blood alcohol level of 115 per toxicology reports. He has been drinking heavily since the age of 63. He presented to the hospital intoxicated requiring detoxification treatment. Jule is in the early stage development of elevated liver functions. He was made aware of this issue with his liver enzymes. To re-stabilize his systems of alcohol intoxication, Amire was ordered and received Librium detox protocols. He was also medicated with Prozac 10 mg daily for anxiousness and Trazodone 100 mg Q bedtime for sleep. He presented with high blood pressure readings while a patient in this hospital. Lisinopril 10 mg was initiated and he is being discharged on it. He was encouraged to follow-up care with his primary care physician to keep his blood pressure in checks and adjust his blood pressure medicine accordingly. Rosendo was enrolled and participated in the group sessions, AA/NA meetings being offered and held on this unit. He learned coping skills.  Jquan has completed detox treatment and his mood is stable. He is being discharged to continue further substance abuse treatment and medication management/routine psychiatric care at the [facility] here in [place], [place]. He is provided with all the pertinent information required to make this appointment without problems. Upon discharge, Abyan adamantly denies any SIHI, AVH, delusional thoughts, paranoia and or withdrawal symptoms. He was provided with a 4 days worth supply samples of his [facility] discharged medications. He left [facility] with all personal belongings in no apparent distress. Transportation per mother.  Consults:  psychiatry  Significant Diagnostic Studies:  labs: CBC with diff, CMP, UDS, toxicology tests, U/A  Discharge Vitals:   Blood pressure 145/86, pulse 96, temperature 98.2 F (36.8 C), temperature source Oral, resp. rate 19, height 5' 9.25" (1.759 m), weight 76.204 kg (168 lb). Body mass  index is 24.63 kg/(m^2). Lab Results:   Results for orders placed during the hospital encounter of 03/16/14 (from the past 72 hour(s))  CBC WITH DIFFERENTIAL     Status: Abnormal   Collection Time    03/16/14 12:25 PM      Result Value Ref Range   WBC 4.4  4.0 - 10.5 K/uL   RBC 5.57  4.22 - 5.81 MIL/uL   Hemoglobin 17.3 (*) 13.0 - 17.0 g/dL   HCT 48.6  39.0 - 52.0 %   MCV 87.3  78.0 - 100.0 fL   MCH 31.1  26.0 - 34.0 pg   MCHC 35.6  30.0 - 36.0 g/dL   RDW 12.1  11.5 - 15.5 %   Platelets 239  150 - 400 K/uL   Neutrophils Relative % 53  43 - 77 %   Neutro Abs 2.3  1.7 - 7.7 K/uL   Lymphocytes Relative 40  12 - 46 %   Lymphs Abs 1.7  0.7 - 4.0 K/uL   Monocytes Relative 6  3 - 12 %   Monocytes Absolute 0.3  0.1 - 1.0 K/uL   Eosinophils Relative 0  0 - 5 %   Eosinophils Absolute 0.0  0.0 - 0.7 K/uL   Basophils Relative 1  0 - 1 %  Basophils Absolute 0.0  0.0 - 0.1 K/uL  COMPREHENSIVE METABOLIC PANEL     Status: Abnormal   Collection Time    03/16/14 12:25 PM      Result Value Ref Range   Sodium 140  137 - 147 mEq/L   Potassium 4.0  3.7 - 5.3 mEq/L   Chloride 97  96 - 112 mEq/L   CO2 26  19 - 32 mEq/L   Glucose, Bld 126 (*) 70 - 99 mg/dL   BUN 14  6 - 23 mg/dL   Creatinine, Ser 0.96  0.50 - 1.35 mg/dL   Calcium 9.3  8.4 - 10.5 mg/dL   Total Protein 8.2  6.0 - 8.3 g/dL   Albumin 4.7  3.5 - 5.2 g/dL   AST 63 (*) 0 - 37 U/L   ALT 60 (*) 0 - 53 U/L   Alkaline Phosphatase 99  39 - 117 U/L   Total Bilirubin 1.5 (*) 0.3 - 1.2 mg/dL   GFR calc non Af Amer >90  >90 mL/min   GFR calc Af Amer >90  >90 mL/min   Comment: (NOTE)     The eGFR has been calculated using the CKD EPI equation.     This calculation has not been validated in all clinical situations.     eGFR's persistently <90 mL/min signify possible Chronic Kidney     Disease.  ETHANOL     Status: Abnormal   Collection Time    03/16/14 12:25 PM      Result Value Ref Range   Alcohol, Ethyl (B) 115 (*) 0 - 11 mg/dL    Comment:            LOWEST DETECTABLE LIMIT FOR     SERUM ALCOHOL IS 11 mg/dL     FOR MEDICAL PURPOSES ONLY  URINALYSIS, ROUTINE W REFLEX MICROSCOPIC     Status: Abnormal   Collection Time    03/16/14 12:46 PM      Result Value Ref Range   Color, Urine AMBER (*) YELLOW   Comment: BIOCHEMICALS MAY BE AFFECTED BY COLOR   APPearance CLOUDY (*) CLEAR   Specific Gravity, Urine 1.035 (*) 1.005 - 1.030   pH 8.0  5.0 - 8.0   Glucose, UA NEGATIVE  NEGATIVE mg/dL   Hgb urine dipstick NEGATIVE  NEGATIVE   Bilirubin Urine NEGATIVE  NEGATIVE   Ketones, ur NEGATIVE  NEGATIVE mg/dL   Protein, ur 100 (*) NEGATIVE mg/dL   Urobilinogen, UA 1.0  0.0 - 1.0 mg/dL   Nitrite NEGATIVE  NEGATIVE   Leukocytes, UA NEGATIVE  NEGATIVE  URINE MICROSCOPIC-ADD ON     Status: None   Collection Time    03/16/14 12:46 PM      Result Value Ref Range   Urine-Other MUCOUS PRESENT     Comment: AMORPHOUS URATES/PHOSPHATES    Physical Findings: AIMS: Facial and Oral Movements Muscles of Facial Expression: None, normal Lips and Perioral Area: None, normal Jaw: None, normal Tongue: None, normal,Extremity Movements Upper (arms, wrists, hands, fingers): None, normal Lower (legs, knees, ankles, toes): None, normal, Trunk Movements Neck, shoulders, hips: None, normal, Overall Severity Severity of abnormal movements (highest score from questions above): None, normal Incapacitation due to abnormal movements: None, normal Patient's awareness of abnormal movements (rate only patient's report): No Awareness, Dental Status Current problems with teeth and/or dentures?: No Does patient usually wear dentures?: No  CIWA:  CIWA-Ar Total: 0 COWS:  COWS Total Score: 9  Psychiatric Specialty Exam: See Psychiatric  Specialty Exam and Suicide Risk Assessment completed by Attending Physician prior to discharge.  Discharge destination:  Home  Is patient on multiple antipsychotic therapies at discharge:  No   Has Patient had three  or more failed trials of antipsychotic monotherapy by history:  No  Recommended Plan for Multiple Antipsychotic Therapies: NA    Medication List    STOP taking these medications       DEXILANT 60 MG capsule  Generic drug:  dexlansoprazole     venlafaxine XR 37.5 MG 24 hr capsule  Commonly known as:  EFFEXOR-XR      TAKE these medications     Indication   FLUoxetine 10 MG capsule  Commonly known as:  PROZAC  Take 1 capsule (10 mg total) by mouth daily. For depression   Indication:  Major Depressive Disorder     lisinopril 10 MG tablet  Commonly known as:  PRINIVIL,ZESTRIL  Take 1 tablet (10 mg total) by mouth daily. For high blood pressure   Indication:  High Blood Pressure     traZODone 100 MG tablet  Commonly known as:  DESYREL  Take 1 tablet (100 mg total) by mouth at bedtime as needed for sleep.   Indication:  Trouble Sleeping       Follow-up Information   Follow up with [facility] On 03/23/2014. (Appt. at 10AM with [facility]. [facility] for hospital follow-up/assessment for SA IOP and medication managment. Please call at least 48 hours to appt to reschedule if needed.  )    Contact information:   [address] [place], [place] [address] Phone: [phone] Fax: [phone]      Follow-up recommendations:  Activity:  As tolerated Diet: As recommended by your primary care doctor. Keep all scheduled follow-up appointments as recommended.  Comments: Take all your medications as prescribed by your mental healthcare provider. Report any adverse effects and or reactions from your medicines to your outpatient provider promptly. Patient is instructed and cautioned to not engage in alcohol and or illegal drug use while on prescription medicines. In the event of worsening symptoms, patient is instructed to call the crisis hotline, 911 and or go to the nearest ED for appropriate evaluation and treatment of symptoms. Follow-up with your primary care provider for your other  medical issues, concerns and or health care needs.    Total Discharge Time:  Greater than 30 minutes.  Signed: Encarnacion Slates, PMHNP-BC 03/19/2014, 9:50 AM Personally evaluated the patient and agree with assessment and plan Geralyn Flash A. Sabra Heck, M.D.

## 2014-03-19 NOTE — Progress Notes (Signed)
[facility] :  Will you be returning to the same living situation after discharge: Yes,  returning home At discharge, do you have transportation home?:Yes,  mom will pick pt up Do you have the ability to pay for your medications:Yes,  provided pt with prescriptions and pt verbalizes ability to afford meds.   Release of information consent forms completed and in the chart;  Patient's signature needed at discharge.  Patient to Follow up at: Follow-up Information   Follow up with [facility] On 03/23/2014. (Appt. at 10AM with [facility]. [facility] for hospital follow-up/assessment for SA IOP and medication managment. Please call at least 48 hours to appt to reschedule if needed.  )    Contact information:   [address] [place], [place] [address] Phone: [phone] Fax: [phone]       Patient denies SI/HI:   Yes,  denies SI/HI    Safety Planning and Suicide Prevention discussed:  Yes,  discussed with pt. N/A to contact family/friend due to no SI on admission.  See suicide prevention education note.   Ludivina Guymon N Horton 03/19/2014, 10:09 AM

## 2014-03-19 NOTE — [facility] Suicide Risk Assessment (Signed)
Suicide Risk Assessment  Discharge Assessment     Demographic Factors:  Male, Adolescent or young adult and Caucasian  Total Time spent with patient: 45 minutes  Psychiatric Specialty Exam:     Blood pressure 145/86, pulse 96, temperature 98.2 F (36.8 C), temperature source Oral, resp. rate 19, height 5' 9.25" (1.759 m), weight 76.204 kg (168 lb).Body mass index is 24.63 kg/(m^2).  General Appearance: Fairly Groomed  Patent attorneyye Contact::  Fair  Speech:  Clear and Coherent  Volume:  Normal  Mood:  Euthymic  Affect:  Appropriate  Thought Process:  Coherent and Goal Directed  Orientation:  Full (Time, Place, and Person)  Thought Content:  plans as he moves forward, relapse prevention plan  Suicidal Thoughts:  No  Homicidal Thoughts:  No  Memory:  Immediate;   Fair Recent;   Fair Remote;   Fair  Judgement:  Fair  Insight:  Present  Psychomotor Activity:  Normal  Concentration:  Fair  Recall:  FiservFair  Fund of Knowledge:NA  Language: Fair  Akathisia:  No  Handed:  Right  AIMS (if indicated):     Assets:  Desire for Improvement Housing Leisure Time Social Support Talents/Skills Transportation  Sleep:  Number of Hours: 6.75    Musculoskeletal: Strength & Muscle Tone: within normal limits Gait & Station: normal Patient leans: N/A   Mental Status Per Nursing Assessment::   On Admission:     Current Mental Status by Physician: In full contact with reality. There are no active S/S of withdrawal. Mood is euthymic, Affect is appropriate. Willing and motivated to pursue further outpatient treatment   Loss Factors: NA  Historical Factors: NA  Risk Reduction Factors:   Sense of responsibility to family, Living with another person, especially a relative, Positive social support and Positive coping skills or problem solving skills  Continued Clinical Symptoms:  Alcohol/Substance Abuse/Dependencies  Cognitive Features That Contribute To Risk: None identified   Suicide  Risk:  Minimal: No identifiable suicidal ideation.  Patients presenting with no risk factors but with morbid ruminations; may be classified as minimal risk based on the severity of the depressive symptoms  Discharge Diagnoses:   AXIS I:  Alcohol Abuse, GAD  AXIS II:  No diagnosis AXIS III:   Past Medical History  Diagnosis Date  . Broken arm   . Panic attacks   . Anxiety   . Depression   . Alcoholism    AXIS IV:  other psychosocial or environmental problems AXIS V:  61-70 mild symptoms  Plan Of Care/Follow-up recommendations:  Activity:  as tolerated Diet:  regular Follow up outpatient basis Is patient on multiple antipsychotic therapies at discharge:  No   Has Patient had three or more failed trials of antipsychotic monotherapy by history:  No  Recommended Plan for Multiple Antipsychotic Therapies: NA    Edward FeeIrving A Vladimir Singh 03/19/2014, 9:34 AM

## 2014-03-24 NOTE — Progress Notes (Signed)
Patient Discharge Instructions:  After Visit Summary (AVS):   Faxed to:  03/24/14 Discharge Summary Note:   Faxed to:  03/24/14 Psychiatric Admission Assessment Note:   Faxed to:  03/24/14 Suicide Risk Assessment - Discharge Assessment:   Faxed to:  03/24/14 Faxed/Sent to the Next Level Care provider:  03/24/14 Faxed to [facility] @ [phone]  Jerelene ReddenSheena E Liberty, 03/24/2014, 4:25 PM

## 2016-09-24 ENCOUNTER — Encounter (HOSPITAL_COMMUNITY): Payer: Self-pay | Admitting: Emergency Medicine

## 2016-09-24 ENCOUNTER — Emergency Department (HOSPITAL_COMMUNITY)
Admission: EM | Admit: 2016-09-24 | Discharge: 2016-09-24 | Disposition: A | Discharge status: Home / Self Care | Payer: 59 | Attending: Emergency Medicine | Admitting: Emergency Medicine

## 2016-09-24 DIAGNOSIS — Z79899 Other long term (current) drug therapy: Secondary | ICD-10-CM | POA: Diagnosis not present

## 2016-09-24 DIAGNOSIS — Z87891 Personal history of nicotine dependence: Secondary | ICD-10-CM | POA: Diagnosis not present

## 2016-09-24 DIAGNOSIS — F1010 Alcohol abuse, uncomplicated: Secondary | ICD-10-CM | POA: Diagnosis present

## 2016-09-24 MED ORDER — LORAZEPAM 1 MG PO TABS
1.0000 mg | ORAL_TABLET | Freq: Once | ORAL | Status: AC
  Administered 2016-09-24: 1 mg via ORAL
  Filled 2016-09-24: qty 1

## 2016-09-24 NOTE — ED Triage Notes (Signed)
Patient states he has been on a drinking binge for the past week. He started back drinking 3 months ago. 6-12 beers/liquor a day for the past 3 months. [facility]. Per patient's visitor, patient was "freaking out and requesting to come to hospital."

## 2016-09-24 NOTE — Discharge Instructions (Signed)
It was our pleasure to provide your ER care today - we hope that you feel better.  Go directly to [facility] as planned.   Return to ER if worse, new symptoms, persistent vomiting, seizures, other medical emergency.   You were given medication in the ER - no driving for the next 6 hours.

## 2016-09-24 NOTE — ED Provider Notes (Signed)
[facility]-EMERGENCY DEPT Provider Note   CSN: [id number] Arrival date & time: 09/24/16  [id number]     History   Chief Complaint Chief Complaint  Patient presents with  . Alcohol Problem    HPI Edward Singh is a 30 y.o. male.  Patient c/o feeling anxious this AM about entering alcohol treatment program.  Patient [facility] this AM at 9, and got to feeling anxious/stressed.  Last drank last pm.  When stops drinking occasionally feels anxious and shaky. No hx seizures. No hx dts. Is eating and drinking. No vomiting. No abd pain.  Patient indicates otherwise recent health at baseline, no recent/acute illness.    The history is provided by the patient.  Alcohol Problem  Pertinent negatives include no chest pain, no abdominal pain, no headaches and no shortness of breath.    Past Medical History:  Diagnosis Date  . Alcoholism (HCC)   . Anxiety   . Broken arm   . Depression   . Panic attacks     Patient Active Problem List   Diagnosis Date Noted  . Alcohol dependence (HCC) 03/16/2014  . Anxiety 03/12/2014    History reviewed. No pertinent surgical history.     Home Medications    Prior to Admission medications   Medication Sig Start Date End Date Taking? Authorizing Provider  FLUoxetine (PROZAC) 10 MG capsule Take 1 capsule (10 mg total) by mouth daily. For depression 03/19/14   Sanjuana KavaAgnes I Nwoko, NP  lisinopril (PRINIVIL,ZESTRIL) 10 MG tablet Take 1 tablet (10 mg total) by mouth daily. For high blood pressure 03/19/14   Sanjuana KavaAgnes I Nwoko, NP  traZODone (DESYREL) 100 MG tablet Take 1 tablet (100 mg total) by mouth at bedtime as needed for sleep. 03/19/14   Sanjuana KavaAgnes I Nwoko, NP    Family History No family history on file.  Social History Social History  Substance Use Topics  . Smoking status: Former Games developermoker  . Smokeless tobacco: Current User    Types: Chew  . Alcohol use Yes     Allergies   Patient has no known allergies.   Review  of Systems Review of Systems  Constitutional: Negative for fever.  HENT: Negative for sore throat.   Respiratory: Negative for cough and shortness of breath.   Cardiovascular: Negative for chest pain.  Gastrointestinal: Negative for abdominal pain and vomiting.  Genitourinary: Negative for flank pain.  Neurological: Negative for headaches.  Hematological: Does not bruise/bleed easily.  Psychiatric/Behavioral: The patient is nervous/anxious.      Physical Exam Updated Vital Signs BP (!) 162/118 (BP Location: Left Arm)   Pulse 94   Temp 97.8 F (36.6 C) (Oral)   Resp 18   Ht 5\' 9"  (1.753 m)   Wt 81.6 kg   SpO2 98%   BMI 26.58 kg/m   Physical Exam  Constitutional: He is oriented to person, place, and time. He appears well-developed and well-nourished. No distress.  HENT:  Mouth/Throat: Oropharynx is clear and moist.  Eyes: Conjunctivae are normal. Pupils are equal, round, and reactive to light.  Neck: Neck supple. No tracheal deviation present.  Cardiovascular: Normal rate, regular rhythm, normal heart sounds and intact distal pulses.   Pulmonary/Chest: Effort normal and breath sounds normal. No accessory muscle usage. No respiratory distress.  Abdominal: Soft. Bowel sounds are normal. He exhibits no distension. There is no tenderness.  Musculoskeletal: He exhibits no edema.  Neurological: He is alert and oriented to person, place, and time.  Steady gait. No tremor or shakes.   Skin: Skin is warm and dry. He is not diaphoretic.  Psychiatric:  Mildly anxious.   Nursing note and vitals reviewed.    ED Treatments / Results  Labs (all labs ordered are listed, but only abnormal results are displayed) Labs Reviewed - No data to display  EKG  EKG Interpretation None       Radiology No results found.  Procedures Procedures (including critical care time)  Medications Ordered in ED Medications  LORazepam (ATIVAN) tablet 1 mg (not administered)     Initial  Impression / Assessment and Plan / ED Course  I have reviewed the triage vital signs and the nursing notes.  Pertinent labs & imaging results that were available during my care of the patient were reviewed by me and considered in my medical decision making (see chart for details).  Clinical Course     Patient has ride here, who plans to take him to [facility] appt at 9 AM.  Ativan 1 mgpo.  Recheck, pt calm and alert.  Patient currently appears stable for d/c.     Final Clinical Impressions(s) / ED Diagnoses   Final diagnoses:  None    New Prescriptions New Prescriptions   No medications on file     Cathren LaineKevin Caidan Hubbert, MD 09/24/16 [phone]

## 2017-02-06 ENCOUNTER — Ambulatory Visit: Payer: Self-pay | Admitting: Neurology

## 2017-04-16 ENCOUNTER — Ambulatory Visit: Payer: Self-pay | Admitting: Neurology

## 2018-09-18 ENCOUNTER — Other Ambulatory Visit: Payer: Self-pay

## 2018-09-18 MED ORDER — DULOXETINE HCL 60 MG PO CPEP: 60.0000 mg | ORAL_CAPSULE | Freq: Every day | ORAL | 1 refills | Status: DC

## 2019-02-17 ENCOUNTER — Ambulatory Visit: Payer: Self-pay | Admitting: Psychiatry

## 2019-03-10 ENCOUNTER — Encounter: Payer: Self-pay | Admitting: Psychiatry

## 2019-03-10 ENCOUNTER — Encounter

## 2019-03-10 ENCOUNTER — Ambulatory Visit (INDEPENDENT_AMBULATORY_CARE_PROVIDER_SITE_OTHER): Payer: 59 | Admitting: Psychiatry

## 2019-03-10 ENCOUNTER — Other Ambulatory Visit: Payer: Self-pay

## 2019-03-10 DIAGNOSIS — F3342 Major depressive disorder, recurrent, in full remission: Secondary | ICD-10-CM

## 2019-03-10 DIAGNOSIS — F419 Anxiety disorder, unspecified: Secondary | ICD-10-CM

## 2019-03-10 MED ORDER — DULOXETINE HCL 60 MG PO CPEP: 60.0000 mg | ORAL_CAPSULE | Freq: Every day | ORAL | 1 refills | Status: DC

## 2019-03-10 NOTE — Progress Notes (Signed)
Edward Singh [id number] February 29, 1986 32 y.o.  Virtual Visit via Telephone Note  I connected with@ on 03/10/19 at  1:30 PM EDT by telephone and verified that I am speaking with the correct person using two identifiers.   I discussed the limitations, risks, security and privacy concerns of performing an evaluation and management service by telephone and the availability of in person appointments. I also discussed with the patient that there may be a patient responsible charge related to this service. The patient expressed understanding and agreed to proceed.   I discussed the assessment and treatment plan with the patient. The patient was provided an opportunity to ask questions and all were answered. The patient agreed with the plan and demonstrated an understanding of the instructions.   The patient was advised to call back or seek an in-person evaluation if the symptoms worsen or if the condition fails to improve as anticipated.  I provided 10 minutes of non-face-to-face time during this encounter.  The patient was located at home.  The provider was located at home.   Corie Chiquito, PMHNP   Subjective:   Patient ID:  Edward Singh is a 33 y.o. (DOB January 29, 1986) male.  Chief Complaint: No chief complaint on file.   HPI Edward Singh presents for follow-up of anxiety, depression, and insomnia. "The medicine is going good." He reports that his mood has been stable. Denies depressed. Reports rare episodes of anxiety and only in response to stressors. Sleep has been adequate. Denies low energy or motivation. Denies impaired concentration. Denies SI.    Review of Systems:  Review of Systems  Musculoskeletal: Negative for gait problem.  Neurological: Negative for tremors.  Psychiatric/Behavioral:       Please refer to HPI    Medications: I have reviewed the patient's current medications.  Current Outpatient Medications  Medication Sig Dispense Refill  . DULoxetine  (CYMBALTA) 60 MG capsule Take 1 capsule (60 mg total) by mouth daily. 90 capsule 1  . pantoprazole (PROTONIX) 20 MG tablet Take 20 mg by mouth daily.     No current facility-administered medications for this visit.     Medication Side Effects: None  Allergies: No Known Allergies  Past Medical History:  Diagnosis Date  . Alcoholism (HCC)   . Anxiety   . Broken arm   . Depression   . GERD (gastroesophageal reflux disease)   . Panic attacks     No family history on file.  Social History   Socioeconomic History  . Marital status: Single    Spouse name: Not on file  . Number of children: Not on file  . Years of education: Not on file  . Highest education level: Not on file  Occupational History  . Not on file  Social Needs  . Financial resource strain: Not on file  . Food insecurity:    Worry: Not on file    Inability: Not on file  . Transportation needs:    Medical: Not on file    Non-medical: Not on file  Tobacco Use  . Smoking status: Former Games developer  . Smokeless tobacco: Current User    Types: Chew  Substance and Sexual Activity  . Alcohol use: Not Currently  . Drug use: No  . Sexual activity: Yes    Birth control/protection: None  Lifestyle  . Physical activity:    Days per week: Not on file    Minutes per session: Not on file  . Stress: Not on file  Relationships  .  Social connections:    Talks on phone: Not on file    Gets together: Not on file    Attends religious service: Not on file    Active member of club or organization: Not on file    Attends meetings of clubs or organizations: Not on file    Relationship status: Not on file  . Intimate partner violence:    Fear of current or ex partner: Not on file    Emotionally abused: Not on file    Physically abused: Not on file    Forced sexual activity: Not on file  Other Topics Concern  . Not on file  Social History Narrative  . Not on file    Past Medical History, Surgical history, Social  history, and Family history were reviewed and updated as appropriate.   Please see review of systems for further details on the patient's review from today.   Objective:   Physical Exam:  There were no vitals taken for this visit.  Physical Exam Neurological:     Mental Status: He is alert and oriented to person, place, and time.     Cranial Nerves: No dysarthria.  Psychiatric:        Attention and Perception: Attention normal.        Mood and Affect: Mood normal.        Speech: Speech normal.        Behavior: Behavior is cooperative.        Thought Content: Thought content normal. Thought content is not paranoid or delusional. Thought content does not include homicidal or suicidal ideation. Thought content does not include homicidal or suicidal plan.        Cognition and Memory: Cognition and memory normal.        Judgment: Judgment normal.     Lab Review:     Component Value Date/Time   NA 140 03/16/2014 1225   K 4.0 03/16/2014 1225   CL 97 03/16/2014 1225   CO2 26 03/16/2014 1225   GLUCOSE 126 (H) 03/16/2014 1225   BUN 14 03/16/2014 1225   CREATININE 0.96 03/16/2014 1225   CALCIUM 9.3 03/16/2014 1225   PROT 8.2 03/16/2014 1225   ALBUMIN 4.7 03/16/2014 1225   AST 63 (H) 03/16/2014 1225   ALT 60 (H) 03/16/2014 1225   ALKPHOS 99 03/16/2014 1225   BILITOT 1.5 (H) 03/16/2014 1225   GFRNONAA >90 03/16/2014 1225   GFRAA >90 03/16/2014 1225       Component Value Date/Time   WBC 4.4 03/16/2014 1225   RBC 5.57 03/16/2014 1225   HGB 17.3 (H) 03/16/2014 1225   HCT 48.6 03/16/2014 1225   PLT 239 03/16/2014 1225   MCV 87.3 03/16/2014 1225   MCH 31.1 03/16/2014 1225   MCHC 35.6 03/16/2014 1225   RDW 12.1 03/16/2014 1225   LYMPHSABS 1.7 03/16/2014 1225   MONOABS 0.3 03/16/2014 1225   EOSABS 0.0 03/16/2014 1225   BASOSABS 0.0 03/16/2014 1225    No results found for: POCLITH, LITHIUM   No results found for: PHENYTOIN, PHENOBARB, VALPROATE, CBMZ   .res Assessment:  Plan:   Discussed treatment plan and patient reports that Cymbalta 60 mg daily continues to work well for his mood and anxiety signs and symptoms.  He reports that he may wish to consider dose reduction or attempting to come off of Cymbalta in the future but would like to continue current dose at this time.  Will continue Cymbalta 60 mg daily for mood and  anxiety. Patient to follow-up in 6 months or sooner if clinically indicated.  Anxiety - Plan: DULoxetine (CYMBALTA) 60 MG capsule  Recurrent major depressive disorder, in full remission (HCC) - Plan: DULoxetine (CYMBALTA) 60 MG capsule  Please see After Visit Summary for patient specific instructions.  No future appointments.  No orders of the defined types were placed in this encounter.     -------------------------------

## 2019-05-28 ENCOUNTER — Ambulatory Visit (HOSPITAL_COMMUNITY)
Admission: RE | Admit: 2019-05-28 | Discharge: 2019-05-28 | Disposition: A | Discharge status: Home / Self Care | Payer: 59 | Attending: Psychiatry | Admitting: Psychiatry

## 2019-05-28 DIAGNOSIS — F1020 Alcohol dependence, uncomplicated: Secondary | ICD-10-CM | POA: Insufficient documentation

## 2019-05-28 DIAGNOSIS — F329 Major depressive disorder, single episode, unspecified: Secondary | ICD-10-CM | POA: Diagnosis present

## 2019-05-28 NOTE — BH Assessment (Signed)
Assessment Note  Edward Singh is an 33 y.o. male. Pt reports severe alcohol use. Pt denies SI/HI and AVH. Pt states he drinks beer and liquor. Pt states his last drink was this morning. [facility]. Pt is seeking detox and inpatient treatment. Pt denies current outpatient treatment.   Edward FillersLaShunda, NP recommends D/C and outpatient resources.   Diagnosis: F10.20 Alcohol use, severe   Past Medical History:  Past Medical History:  Diagnosis Date  . Alcoholism (HCC)   . Anxiety   . Broken arm   . Depression   . GERD (gastroesophageal reflux disease)   . Panic attacks     Past Surgical History:  Procedure Laterality Date  . WISDOM TOOTH EXTRACTION      Family History: No family history on file.  Social History:  reports that he has quit smoking. His smokeless tobacco use includes chew. He reports previous alcohol use. He reports that he does not use drugs.  Additional Social History:  Alcohol / Drug Use Pain Medications: please see mar Prescriptions: please see mar Over the Counter: please see mar History of alcohol / drug use?: Yes Negative Consequences of Use: Financial, Legal, Personal relationships, Work / School Withdrawal Symptoms: Agitation, Irritability, Aggressive/Assaultive, Nausea / Vomiting Substance #1 Name of Substance 1: alcohol 1 - Age of First Use: unknown 1 - Amount (size/oz): unknown 1 - Frequency: unknown 1 - Duration: unknown 1 - Last Use / Amount: unknown  CIWA:   COWS:    Allergies: No Known Allergies  Home Medications: (Not in a hospital admission)   OB/GYN Status:  No LMP for male patient.  General Assessment Data [facility] of Assessment[facility] TTS Assessment: In system Is this a Tele or Face-to-Face Assessment?: Face-to-Face Is this an Initial Assessment or a Re-assessment for this encounter?: Initial Assessment Patient Accompanied by:: Other Language Other than English: No Living  Arrangements: Other (Comment) What gender do you identify as?: Male Marital status: Married LincolnMaiden name: NA Pregnancy Status: No Living Arrangements: Spouse/significant other Can pt return to current living arrangement?: Yes Admission Status: Voluntary Is patient capable of signing voluntary admission?: Yes Referral Source: Self/Family/Friend Insurance type: [employer]  Medical Screening Exam ([facility] Walk-in ONLY) Medical Exam completed: Yes  Crisis Care Plan Living Arrangements: Spouse/significant other Legal Guardian: Other:(NA) Name of Psychiatrist: NA Name of Therapist: NA  Education Status Is patient currently in school?: No Is the patient employed, unemployed or receiving disability?: Employed  Risk to self with the past 6 months Suicidal Ideation: No Has patient been a risk to self within the past 6 months prior to admission? : No Suicidal Intent: No Has patient had any suicidal intent within the past 6 months prior to admission? : No Is patient at risk for suicide?: No Suicidal Plan?: No Has patient had any suicidal plan within the past 6 months prior to admission? : No Access to Means: No What has been your use of drugs/alcohol within the last 12 months?: NA Previous Attempts/Gestures: No How many times?: 0 Other Self Harm Risks: NA Triggers for Past Attempts: Other (Comment)(SA) Intentional Self Injurious Behavior: None Family Suicide History: No Recent stressful life event(s): Other (Comment)(SA) Persecutory voices/beliefs?: No Depression: Yes Depression Symptoms: Loss of interest in usual pleasures, Feeling worthless/self pity Substance abuse history and/or treatment for substance abuse?: Yes Suicide prevention information given to non-admitted patients: Not applicable  Risk to Others within the past 6 months Homicidal Ideation: No Does patient have  any lifetime risk of violence toward others beyond the six months prior to admission? : No Thoughts of Harm to  Others: No Current Homicidal Intent: No Current Homicidal Plan: No Access to Homicidal Means: No Identified Victim: NA History of harm to others?: No Assessment of Violence: None Noted Violent Behavior Description: NA Does patient have access to weapons?: No Criminal Charges Pending?: No Does patient have a court date: No Is patient on probation?: No  Psychosis Hallucinations: None noted Delusions: None noted  Mental Status Report Appearance/Hygiene: Unremarkable Eye Contact: Fair Motor Activity: Freedom of movement Speech: Logical/coherent Level of Consciousness: Alert Mood: Anxious Affect: Anxious Anxiety Level: Minimal Thought Processes: Coherent, Relevant Judgement: Unimpaired Orientation: Person, Place, Time, Situation Obsessive Compulsive Thoughts/Behaviors: None  Cognitive Functioning Concentration: Normal Memory: Recent Intact, Remote Intact Is patient IDD: No Insight: Fair Impulse Control: Fair Appetite: Fair Have you had any weight changes? : No Change Sleep: No Change Total Hours of Sleep: 8 Vegetative Symptoms: None  ADLScreening [facility] Assessment Services) Patient's cognitive ability adequate to safely complete daily activities?: Yes Patient able to express need for assistance with ADLs?: Yes Independently performs ADLs?: Yes (appropriate for developmental age)  Prior Inpatient Therapy Prior Inpatient Therapy: Yes Prior Therapy Dates: 2017 Prior Therapy Facilty/Provider(s): [facility] Reason for Treatment: SA  Prior Outpatient Therapy Prior Outpatient Therapy: No Does patient have an ACCT team?: No Does patient have Intensive In-House Services?  : No Does patient have [facility] services? : No Does patient have P4CC services?: No  ADL Screening (condition at time of admission) Patient's cognitive ability adequate to safely complete daily activities?: Yes Is the patient deaf or have difficulty hearing?: No Does the patient have difficulty seeing, even  when wearing glasses/contacts?: No Does the patient have difficulty concentrating, remembering, or making decisions?: No Patient able to express need for assistance with ADLs?: Yes Does the patient have difficulty dressing or bathing?: No Independently performs ADLs?: Yes (appropriate for developmental age)       Abuse/Neglect Assessment (Assessment to be complete while patient is alone) Abuse/Neglect Assessment Can Be Completed: Yes Physical Abuse: Denies Verbal Abuse: Denies Sexual Abuse: Denies Exploitation of patient/patient's resources: Denies     Regulatory affairs officer (For Healthcare) Does Patient Have a Medical Advance Directive?: No Would patient like information on creating a medical advance directive?: No - Patient declined          Disposition:  Disposition Initial Assessment Completed for this Encounter: Yes Disposition of Patient: Discharge Patient refused recommended treatment: No  On Site Evaluation by:   Reviewed with Physician:    Cyndia Bent 05/28/2019 9:52 AM

## 2019-05-28 NOTE — H&P (Signed)
Behavioral Health Medical Screening Exam  Edward Singh is an 33 y.o. male.who presented to [facility] voluntarily as a walk-in. He is requesting detox. Reports a long history of alcohol abuse (10-12 years) with last use this morning stating he drank one beer prior to coming in. .Reports drinking, " several beers" daily, and for the past three days, he has engaged in binge drinking, drinking both beer and liquor. He reports he has been to detox facilites twice one of which was here at [facility] in 2017. Reports longest sobriety as 4 years. He re[ports some depression related to his alcohol use. He denies seizure activity or black outs secondary to use. He denies SI, HI or AVH.   Total Time spent with patient: 15 minutes  Psychiatric Specialty Exam: Physical Exam  Nursing note and vitals reviewed. Constitutional: He is oriented to person, place, and time.  Neurological: He is alert and oriented to person, place, and time.    Review of Systems  Psychiatric/Behavioral: Positive for depression and substance abuse.  All other systems reviewed and are negative.   There were no vitals taken for this visit.There is no height or weight on file to calculate BMI.  General Appearance: Fairly Groomed  Eye Contact:  Good  Speech:  Clear and Coherent and Normal Rate  Volume:  Normal  Mood:  Depressed  Affect:  Congruent  Thought Process:  Coherent, Linear and Descriptions of Associations: Intact  Orientation:  Full (Time, Place, and Person)  Thought Content:  WDL  Suicidal Thoughts:  No  Homicidal Thoughts:  No  Memory:  Immediate;   Fair Recent;   Fair  Judgement:  Fair  Insight:  Fair  Psychomotor Activity:  Normal  Concentration: Concentration: Fair and Attention Span: Fair  Recall:  [employer] of Knowledge:Fair  Language: Good  Akathisia:  Negative  Handed:  Right  AIMS (if indicated):     Assets:  Communication Skills Desire for Improvement Resilience Social Support  Sleep:        Musculoskeletal: Strength & Muscle Tone: within normal limits Gait & Station: normal Patient leans: N/A  There were no vitals taken for this visit.  Recommendations:  Based on my evaluation the patient does not appear to have an emergency medical condition.   No evidence of imminent risk to self or others at present.   Patient does not meet criteria for psychiatric inpatient admission. Reccommended to continue follow-up with resources provided for inpatient or outpatient substance treatment facilities. ..     Mordecai Maes, NP 05/28/2019, 8:54 AM

## 2019-06-03 ENCOUNTER — Telehealth: Payer: Self-pay | Admitting: Psychiatry

## 2019-06-03 DIAGNOSIS — F419 Anxiety disorder, unspecified: Secondary | ICD-10-CM

## 2019-06-03 MED ORDER — HYDROXYZINE PAMOATE 25 MG PO CAPS: 25.0000 mg | ORAL_CAPSULE | Freq: Three times a day (TID) | ORAL | 0 refills | Status: DC | PRN

## 2019-06-03 NOTE — Telephone Encounter (Signed)
Pt. Had started drinking again for the last 5 months. The Cymbalta stopped working. He went to [facility] for detox and has been sober for 1 week now. He says he knows he has to give the Cymbalta more time to work but is trying to get back in a good place. He says his anxiety has been up and bothering him more than usual and this is why he drinks to self medicate the anxiety. He is wanting to know if the Cymbalta should be increased, should he give it more time or is there something non-controlled that he can take for his anxiety. He stated they did have him on Vistaril while in detox but did not keep him on it. Please advise.

## 2019-06-03 NOTE — Telephone Encounter (Signed)
Edward Singh needs to discuss what is going on with him and possibly discuss med changes.He did just pick up his medication as prescribed, but still wants to discuss.  He did make an appt for 8/19 and is on the CXL.  Doesn't want to wait until 8/19 to talk.  Please call.  [phone].  He has an appt. Today between 1-2pm so don't call then.

## 2019-06-04 NOTE — Telephone Encounter (Signed)
Pt. Made aware and verbalized understanding. Will call if there are any issues.

## 2019-06-16 ENCOUNTER — Telehealth: Payer: Self-pay | Admitting: Psychiatry

## 2019-06-16 DIAGNOSIS — F3342 Major depressive disorder, recurrent, in full remission: Secondary | ICD-10-CM

## 2019-06-16 DIAGNOSIS — F419 Anxiety disorder, unspecified: Secondary | ICD-10-CM

## 2019-06-16 MED ORDER — DULOXETINE HCL 30 MG PO CPEP: ORAL_CAPSULE | ORAL | 1 refills | Status: DC

## 2019-06-16 MED ORDER — PROPRANOLOL HCL 10 MG PO TABS: ORAL_TABLET | ORAL | 1 refills | Status: DC

## 2019-06-16 NOTE — Telephone Encounter (Signed)
He reports that he has been having severe anxiety since Saturday and it has been preventing him from being able to get out of bed. He reports that for the past few days he has been having panic attacks on and off throughout the day. He reports minimal relief with hydroxyzine prn. He reports that he has been experiencing depression and low energy and low motivation. Sleep has been fair.  Denies SI.   Discussed potential benefits, risks, and side effects of increasing Cymbalta to 90 mg po qd. Discussed that doses above 60 mg are off label.   Plan: Increase Cymbalta to 90 mg po qd to improve mood and anxiety s/s.Will re-start Propranolol 10 mg 1-2 tabs po BID prn anxiety since this has been helpful in the past for anxiety. Advised pt to contact office with any worsening s/s.   Past Psychiatric Medication Trials: Cymbalta Prozac- Initially effective and then no longer as effective. Felt tired Lexapro- Drowsiness Propranolol-May have been effective for anxiety

## 2019-06-16 NOTE — Telephone Encounter (Signed)
Patient wants to talk to you about his medicines. He left a message. He has an appt on 8/19. Please call him at [phone]

## 2019-06-21 ENCOUNTER — Other Ambulatory Visit: Payer: Self-pay | Admitting: Psychiatry

## 2019-06-21 DIAGNOSIS — F419 Anxiety disorder, unspecified: Secondary | ICD-10-CM

## 2019-06-25 ENCOUNTER — Encounter

## 2019-06-25 ENCOUNTER — Other Ambulatory Visit: Payer: Self-pay

## 2019-06-25 ENCOUNTER — Ambulatory Visit (INDEPENDENT_AMBULATORY_CARE_PROVIDER_SITE_OTHER): Payer: 59 | Admitting: Psychiatry

## 2019-06-25 ENCOUNTER — Encounter: Payer: Self-pay | Admitting: Psychiatry

## 2019-06-25 VITALS — BP 133/93 | HR 65

## 2019-06-25 DIAGNOSIS — F330 Major depressive disorder, recurrent, mild: Secondary | ICD-10-CM

## 2019-06-25 DIAGNOSIS — F419 Anxiety disorder, unspecified: Secondary | ICD-10-CM | POA: Diagnosis not present

## 2019-06-25 MED ORDER — BUSPIRONE HCL 15 MG PO TABS: ORAL_TABLET | ORAL | 1 refills | Status: DC

## 2019-06-25 NOTE — Progress Notes (Signed)
Edward Singh [id number] Sep 18, 1986 33 y.o.  Subjective:   Patient ID:  Edward Singh is a 33 y.o. (DOB 1986-09-22) male.  Chief Complaint:  Chief Complaint  Patient presents with  . Alcohol Problem    Recent relapse  . Anxiety  . Depression    HPI Edward Singh presents to the office today for follow-up after recent relapse on ETOH and worsening anxiety and depression. Pt was treated at [facility]. Reports that he gradually started drinking some and intake gradually increased over 6 months. Denies depression or anxiety prior to drinking. He reports that he started having more depression and anxiety with drinking and became more dependent on ETOH. Denies any other substance use.    Patient had contacted office on August 10 regarding worsening mood and anxiety signs and symptoms and was advised to increase Cymbalta to 90 mg daily.  He reports that he had "brain fog and a headache" with increase in Cymbalta and then decreased dose back to 60 mg.    Mood has been "on and off." He reports periods of depressed mood and then other times mood is ok. Mood has been ok the last 2 days and that mood seems to be gradually improving. Denies irritability.Has not had any more panic attacks in the last 1-2 weeks. Continues to have periods of acute anxiety with physical s/s to include sweating, jittery, feeling detached and some anxious thoughts. Reports appetite has returned. Sleeping ok. Energy and motivation have been "up and down." Concentration also varies. Denies SI.   Has been sober one month now.  Reports that propranolol has been partially effective.   Working now as a Hotel manager. Reports that family life is improving.   Has started seeing Nathaneil Canary, [facility] for the last month. Has been expolring recovery groups.   Past Psychiatric Medication Trials: Cymbalta Prozac- Initially effective and then no longer as effective. Felt tired Lexapro-  Drowsiness Effexor Propranolol-May have been effective for anxiety Librium- prescribed during detox.  Trazodone Vistaril- ineffective  Review of Systems:  Review of Systems  Musculoskeletal: Negative for gait problem.  Neurological: Positive for dizziness. Negative for tremors.  Psychiatric/Behavioral:       Please refer to HPI    Medications: I have reviewed the patient's current medications.  Current Outpatient Medications  Medication Sig Dispense Refill  . DULoxetine (CYMBALTA) 60 MG capsule Take 1 capsule (60 mg total) by mouth daily. 90 capsule 1  . Melatonin 3 MG TABS Take by mouth.    . Multiple Vitamin (MULTIVITAMIN) tablet Take 1 tablet by mouth daily.    . Omega-3 Fatty Acids (FISH OIL PO) Take by mouth.    . propranolol (INDERAL) 10 MG tablet Take 1-2 tabs po BID prn anxiety 120 tablet 1  . busPIRone (BUSPAR) 15 MG tablet Take 1/3 tablet p.o. twice daily for 1 week, then take 2/3 tablet p.o. twice daily for 1 week, then take 1 tablet p.o. twice daily 60 tablet 1   No current facility-administered medications for this visit.     Medication Side Effects: None  Allergies: No Known Allergies  Past Medical History:  Diagnosis Date  . Alcoholism ([place])   . Anxiety   . Broken arm   . Depression   . GERD (gastroesophageal reflux disease)   . Panic attacks     History reviewed. No pertinent family history.  Social History   Socioeconomic History  . Marital status: Single    Spouse name: Not on file  .  Number of children: Not on file  . Years of education: Not on file  . Highest education level: Not on file  Occupational History  . Not on file  Social Needs  . Financial resource strain: Not on file  . Food insecurity    Worry: Not on file    Inability: Not on file  . Transportation needs    Medical: Not on file    Non-medical: Not on file  Tobacco Use  . Smoking status: Former Games developermoker  . Smokeless tobacco: Current User    Types: Chew  Substance and  Sexual Activity  . Alcohol use: Not Currently  . Drug use: No  . Sexual activity: Yes    Birth control/protection: None  Lifestyle  . Physical activity    Days per week: Not on file    Minutes per session: Not on file  . Stress: Not on file  Relationships  . Social Musicianconnections    Talks on phone: Not on file    Gets together: Not on file    Attends religious service: Not on file    Active member of club or organization: Not on file    Attends meetings of clubs or organizations: Not on file    Relationship status: Not on file  . Intimate partner violence    Fear of current or ex partner: Not on file    Emotionally abused: Not on file    Physically abused: Not on file    Forced sexual activity: Not on file  Other Topics Concern  . Not on file  Social History Narrative  . Not on file    Past Medical History, Surgical history, Social history, and Family history were reviewed and updated as appropriate.   Please see review of systems for further details on the patient's review from today.   Objective:   Physical Exam:  BP (!) 133/93   Pulse 65   Physical Exam Constitutional:      General: He is not in acute distress.    Appearance: He is well-developed.  Musculoskeletal:        General: No deformity.  Neurological:     Mental Status: He is alert and oriented to person, place, and time.     Coordination: Coordination normal.  Psychiatric:        Attention and Perception: Perception normal. He is inattentive. He does not perceive auditory or visual hallucinations.        Mood and Affect: Mood is anxious and depressed. Affect is not labile, blunt, angry or inappropriate.        Speech: Speech normal.        Behavior: Behavior normal.        Thought Content: Thought content normal. Thought content is not paranoid or delusional. Thought content does not include homicidal or suicidal ideation. Thought content does not include homicidal or suicidal plan.        Cognition and  Memory: Cognition and memory normal.        Judgment: Judgment normal.     Comments: Insight intact. No delusions.      Lab Review:     Component Value Date/Time   NA 140 03/16/2014 1225   K 4.0 03/16/2014 1225   CL 97 03/16/2014 1225   CO2 26 03/16/2014 1225   GLUCOSE 126 (H) 03/16/2014 1225   BUN 14 03/16/2014 1225   CREATININE 0.96 03/16/2014 1225   CALCIUM 9.3 03/16/2014 1225   PROT 8.2 03/16/2014 1225  ALBUMIN 4.7 03/16/2014 1225   AST 63 (H) 03/16/2014 1225   ALT 60 (H) 03/16/2014 1225   ALKPHOS 99 03/16/2014 1225   BILITOT 1.5 (H) 03/16/2014 1225   GFRNONAA >90 03/16/2014 1225   GFRAA >90 03/16/2014 1225       Component Value Date/Time   WBC 4.4 03/16/2014 1225   RBC 5.57 03/16/2014 1225   HGB 17.3 (H) 03/16/2014 1225   HCT 48.6 03/16/2014 1225   PLT 239 03/16/2014 1225   MCV 87.3 03/16/2014 1225   MCH 31.1 03/16/2014 1225   MCHC 35.6 03/16/2014 1225   RDW 12.1 03/16/2014 1225   LYMPHSABS 1.7 03/16/2014 1225   MONOABS 0.3 03/16/2014 1225   EOSABS 0.0 03/16/2014 1225   BASOSABS 0.0 03/16/2014 1225    No results found for: POCLITH, LITHIUM   No results found for: PHENYTOIN, PHENOBARB, VALPROATE, CBMZ   .res Assessment: Plan:   Gust potential benefits, risks, and side effects of several treatment options for anxiety to include gabapentin, Trileptal, and BuSpar.  Patient agrees to trial of BuSpar. Will continue Cymbalta 60 mg daily for depression and anxiety. Recommend continuing psychotherapy with Bennie DallasHarald Petrini, LPC.  Patient to follow-up with this provider in 6 weeks or sooner if clinically indicated. Patient advised to contact office with any questions, adverse effects, or acute worsening in signs and symptoms.  Casimiro NeedleMichael was seen today for alcohol problem, anxiety and depression.  Diagnoses and all orders for this visit:  Anxiety -     busPIRone (BUSPAR) 15 MG tablet; Take 1/3 tablet p.o. twice daily for 1 week, then take 2/3 tablet p.o. twice  daily for 1 week, then take 1 tablet p.o. twice daily  Mild episode of recurrent major depressive disorder (HCC) -     busPIRone (BUSPAR) 15 MG tablet; Take 1/3 tablet p.o. twice daily for 1 week, then take 2/3 tablet p.o. twice daily for 1 week, then take 1 tablet p.o. twice daily     Please see After Visit Summary for patient specific instructions.  Future Appointments  Date Time Provider Department Center  08/06/2019 11:30 AM Corie Chiquitoarter, Phyllis Abelson, PMHNP [facility] None    No orders of the defined types were placed in this encounter.   -------------------------------

## 2019-07-18 ENCOUNTER — Other Ambulatory Visit: Payer: Self-pay | Admitting: Psychiatry

## 2019-07-18 DIAGNOSIS — F419 Anxiety disorder, unspecified: Secondary | ICD-10-CM

## 2019-08-06 ENCOUNTER — Telehealth: Payer: Self-pay | Admitting: Psychiatry

## 2019-08-06 ENCOUNTER — Ambulatory Visit: Payer: 59 | Admitting: Psychiatry

## 2019-08-06 NOTE — Telephone Encounter (Signed)
Left pt. A VM to return my call.

## 2019-08-06 NOTE — Telephone Encounter (Signed)
Spoke with pt. And he is doing okay. He forgot about his appt. Today so he rescheduled it. He said sorry about that.

## 2019-08-25 ENCOUNTER — Encounter: Payer: Self-pay | Admitting: Psychiatry

## 2019-08-25 ENCOUNTER — Other Ambulatory Visit: Payer: Self-pay

## 2019-08-25 ENCOUNTER — Ambulatory Visit (INDEPENDENT_AMBULATORY_CARE_PROVIDER_SITE_OTHER): Payer: 59 | Admitting: Psychiatry

## 2019-08-25 VITALS — BP 120/83 | HR 80

## 2019-08-25 DIAGNOSIS — F419 Anxiety disorder, unspecified: Secondary | ICD-10-CM

## 2019-08-25 DIAGNOSIS — F3342 Major depressive disorder, recurrent, in full remission: Secondary | ICD-10-CM

## 2019-08-25 DIAGNOSIS — F902 Attention-deficit hyperactivity disorder, combined type: Secondary | ICD-10-CM | POA: Diagnosis not present

## 2019-08-25 MED ORDER — DULOXETINE HCL 60 MG PO CPEP: 60.0000 mg | ORAL_CAPSULE | Freq: Every day | ORAL | 1 refills | Status: DC

## 2019-08-25 MED ORDER — AMPHETAMINE-DEXTROAMPHET ER 10 MG PO CP24: 10.0000 mg | ORAL_CAPSULE | Freq: Every day | ORAL | 0 refills | Status: DC

## 2019-08-25 NOTE — Progress Notes (Signed)
Edward ShapeMichael A Arth [id number] 1986-10-03 32 y.o.  Subjective:   Patient ID:  Edward ShapeMichael A Singh is a 33 y.o. (DOB 1986-10-03) male.  Chief Complaint:  Chief Complaint  Patient presents with  . Depression  . Follow-up    h/o Anxiety, h/o ETOH dependence    HPI Edward ShapeMichael A Singh presents to the office today for follow-up of depression and anxiety. He reports that his energy and concentration have been up and down with some days energy and concentration are much lower than usual. Reports that he feels he needs 1-2 energy drinks. He reports that his mood has been "up and down." Occ brief periods of depression, not lasting more than a day. Denies irritability. Motivation is low and sometimes struggles to get up and go to work. He also loses motivation at times during the work day. "The things I have to do, has become more of a chore" to include work and at home. Appetite has been good. He reports that he is sleeping 7-8 hours a night but often feels like he could sleep much more and often feels tired upon awakening.  He reports that his anxiety has been better. Denies any recent acute anxiety s/s. Reports that he was not able to tolerate Buspar due to dizziness, fatigue, and HA. Denies SI.   Reports that he was tx'd for ADD as a child and was tested twice. He reports difficulty sitting still. Reports starting things and losing interest in things. Frequently losing and misplacing things.  Patient reports that concentration difficulties have recently had a negative impact on his work performance and has some concerns about continued employment.  Reports that he is maintaining sobriety.   Continues to see Bennie DallasHarald Petrini, LPC.   Past Psychiatric Medication Trials: Cymbalta Prozac- Initially effective and then no longer as effective. Felt tired Lexapro- Drowsiness Effexor Propranolol-May have been effective for anxiety Librium- prescribed during detox.  Trazodone Vistaril-  ineffective Ambien May have taken Adderall in school.   Review of Systems:  Review of Systems  Gastrointestinal: Negative.   Musculoskeletal: Negative for gait problem.  Neurological: Negative for tremors and headaches.  Psychiatric/Behavioral:       Please refer to HPI    Medications: I have reviewed the patient's current medications.  Current Outpatient Medications  Medication Sig Dispense Refill  . DULoxetine (CYMBALTA) 60 MG capsule Take 1 capsule (60 mg total) by mouth daily. 90 capsule 1  . Multiple Vitamin (MULTIVITAMIN) tablet Take 1 tablet by mouth daily.    Marland Kitchen. amphetamine-dextroamphetamine (ADDERALL XR) 10 MG 24 hr capsule Take 1 capsule (10 mg total) by mouth daily. 30 capsule 0  . Melatonin 3 MG TABS Take by mouth.    . Omega-3 Fatty Acids (FISH OIL PO) Take by mouth.    . propranolol (INDERAL) 10 MG tablet Take 1-2 tabs po BID prn anxiety 120 tablet 1   No current facility-administered medications for this visit.     Medication Side Effects: None  Allergies: No Known Allergies  Past Medical History:  Diagnosis Date  . Alcoholism (HCC)   . Anxiety   . Broken arm   . Depression   . GERD (gastroesophageal reflux disease)   . Panic attacks     History reviewed. No pertinent family history.  Social History   Socioeconomic History  . Marital status: Single    Spouse name: Not on file  . Number of children: Not on file  . Years of education: Not on file  . Highest education  level: Not on file  Occupational History  . Not on file  Social Needs  . Financial resource strain: Not on file  . Food insecurity    Worry: Not on file    Inability: Not on file  . Transportation needs    Medical: Not on file    Non-medical: Not on file  Tobacco Use  . Smoking status: Former Games developer  . Smokeless tobacco: Current User    Types: Chew  Substance and Sexual Activity  . Alcohol use: Not Currently  . Drug use: No  . Sexual activity: Yes    Birth  control/protection: None  Lifestyle  . Physical activity    Days per week: Not on file    Minutes per session: Not on file  . Stress: Not on file  Relationships  . Social Musician on phone: Not on file    Gets together: Not on file    Attends religious service: Not on file    Active member of club or organization: Not on file    Attends meetings of clubs or organizations: Not on file    Relationship status: Not on file  . Intimate partner violence    Fear of current or ex partner: Not on file    Emotionally abused: Not on file    Physically abused: Not on file    Forced sexual activity: Not on file  Other Topics Concern  . Not on file  Social History Narrative  . Not on file    Past Medical History, Surgical history, Social history, and Family history were reviewed and updated as appropriate.   Please see review of systems for further details on the patient's review from today.   Objective:   Physical Exam:  BP 120/83   Pulse 80   Physical Exam Constitutional:      General: He is not in acute distress.    Appearance: He is well-developed.  Musculoskeletal:        General: No deformity.  Neurological:     Mental Status: He is alert and oriented to person, place, and time.     Coordination: Coordination normal.  Psychiatric:        Attention and Perception: Attention and perception normal. He does not perceive auditory or visual hallucinations.        Mood and Affect: Mood is depressed. Mood is not anxious. Affect is blunt. Affect is not labile, angry or inappropriate.        Speech: Speech normal.        Behavior: Behavior is cooperative.        Thought Content: Thought content normal. Thought content is not paranoid or delusional. Thought content does not include homicidal or suicidal ideation. Thought content does not include homicidal or suicidal plan.        Cognition and Memory: Cognition and memory normal.        Judgment: Judgment normal.      Comments: Insight intact.      Lab Review:     Component Value Date/Time   NA 140 03/16/2014 1225   K 4.0 03/16/2014 1225   CL 97 03/16/2014 1225   CO2 26 03/16/2014 1225   GLUCOSE 126 (H) 03/16/2014 1225   BUN 14 03/16/2014 1225   CREATININE 0.96 03/16/2014 1225   CALCIUM 9.3 03/16/2014 1225   PROT 8.2 03/16/2014 1225   ALBUMIN 4.7 03/16/2014 1225   AST 63 (H) 03/16/2014 1225   ALT 60 (H) 03/16/2014  1225   ALKPHOS 99 03/16/2014 1225   BILITOT 1.5 (H) 03/16/2014 1225   GFRNONAA >90 03/16/2014 1225   GFRAA >90 03/16/2014 1225       Component Value Date/Time   WBC 4.4 03/16/2014 1225   RBC 5.57 03/16/2014 1225   HGB 17.3 (H) 03/16/2014 1225   HCT 48.6 03/16/2014 1225   PLT 239 03/16/2014 1225   MCV 87.3 03/16/2014 1225   MCH 31.1 03/16/2014 1225   MCHC 35.6 03/16/2014 1225   RDW 12.1 03/16/2014 1225   LYMPHSABS 1.7 03/16/2014 1225   MONOABS 0.3 03/16/2014 1225   EOSABS 0.0 03/16/2014 1225   BASOSABS 0.0 03/16/2014 1225    No results found for: POCLITH, LITHIUM   No results found for: PHENYTOIN, PHENOBARB, VALPROATE, CBMZ   .res Assessment: Plan:   Discussed potential benefits, risks, and side effects of several treatment options to include Wellbutrin and Adderall.  Patient reports that he would prefer not to try a new medication at this time considering history of adverse responses to multiple medications in the past, and prefers to restart Adderall XR since this was helpful when he was in school for ADHD. Will start Adderall XR 10 mg p.o. every morning for attention deficit. Discussed potential benefits, risks, and side effects of stimulants with patient to include increased heart rate, palpitations, insomnia, increased anxiety, increased irritability, or decreased appetite.  Instructed patient to contact office if experiencing any significant tolerability issues. Advised patient to decrease caffeine/energy drink intake with starting Adderall XR.  Recommended  gradually reducing caffeine to avoid severe caffeine withdrawal signs and symptoms. Will continue Cymbalta 60 mg daily for anxiety and depression.  Discussed that patient may wish to move administration time of Cymbalta to HMS if he is concerned about Cymbalta possibly causing or contributing to fatigue.   Will continue propanolol as needed for anxiety since patient reports that this is helpful for rare episodes of acute anxiety. Recommend continuing psychotherapy with Jeanne [name], LPC. Patient to follow-up with this provider in 4 weeks or sooner if clinically indicated. Patient advised to contact office with any questions, adverse effects, or acute worsening in signs and symptoms.  Avigdor was seen today for depression and follow-up.  Diagnoses and all orders for this visit:  Attention deficit hyperactivity disorder (ADHD), combined type -     amphetamine-dextroamphetamine (ADDERALL XR) 10 MG 24 hr capsule; Take 1 capsule (10 mg total) by mouth daily.  Anxiety -     DULoxetine (CYMBALTA) 60 MG capsule; Take 1 capsule (60 mg total) by mouth daily.  Recurrent major depressive disorder, in full remission ([place]) -     DULoxetine (CYMBALTA) 60 MG capsule; Take 1 capsule (60 mg total) by mouth daily.     Please see After Visit Summary for patient specific instructions.  Future Appointments  Date Time Provider [place]  09/23/2019  1:30 PM Thayer Headings, PMHNP [facility] None    No orders of the defined types were placed in this encounter.   -------------------------------

## 2019-09-05 ENCOUNTER — Telehealth: Payer: Self-pay | Admitting: Psychiatry

## 2019-09-05 NOTE — Telephone Encounter (Signed)
Error

## 2019-09-23 ENCOUNTER — Ambulatory Visit (INDEPENDENT_AMBULATORY_CARE_PROVIDER_SITE_OTHER): Payer: 59 | Admitting: Psychiatry

## 2019-09-23 ENCOUNTER — Encounter: Payer: Self-pay | Admitting: Psychiatry

## 2019-09-23 ENCOUNTER — Other Ambulatory Visit: Payer: Self-pay

## 2019-09-23 DIAGNOSIS — F3342 Major depressive disorder, recurrent, in full remission: Secondary | ICD-10-CM | POA: Diagnosis not present

## 2019-09-23 DIAGNOSIS — F331 Major depressive disorder, recurrent, moderate: Secondary | ICD-10-CM | POA: Diagnosis not present

## 2019-09-23 DIAGNOSIS — F419 Anxiety disorder, unspecified: Secondary | ICD-10-CM

## 2019-09-23 MED ORDER — DESVENLAFAXINE SUCCINATE ER 100 MG PO TB24: 100.0000 mg | ORAL_TABLET | Freq: Every day | ORAL | 1 refills | Status: DC

## 2019-09-23 MED ORDER — DESVENLAFAXINE SUCCINATE ER 50 MG PO TB24: ORAL_TABLET | ORAL | 0 refills | Status: DC

## 2019-09-23 MED ORDER — DULOXETINE HCL 30 MG PO CPEP: 30.0000 mg | ORAL_CAPSULE | Freq: Every day | ORAL | 0 refills | Status: DC

## 2019-09-23 NOTE — Patient Instructions (Signed)
Decrease Cymbalta to 30 mg in the morning for one week, then stop.   Start Pristiq (Desvenlafaxine) 50 mg in the morning for one week (while also taking Cymbalta 30 mg in the morning), then increase to 100 mg daily.

## 2019-09-23 NOTE — Progress Notes (Signed)
Edward Singh [id number] 03/20/1986 33 y.o.  Subjective:   Patient ID:  Edward Singh is a 33 y.o. (DOB 1985-12-27) male.  Chief Complaint:  Chief Complaint  Patient presents with  . Depression  . Anxiety  . ADD    HPI Edward Singh presents to the office today for follow-up of ADD, Depression, and Anxiety. He reports that Adderall seemed to be initially effective and then had side effects that outweighed benefits to include sweating and "brain zaps." He reports that his mood has been "up and down" with various degrees of depression. Denies irritability. He reports occasional bursts of energy for a few hours to a day. Some brief periods where mood is better. Denies excessive spending. Reports that he has had some anxiety that has been manageable. Sleeping well. Appetite has been stable. Motivation remains low. Energy is also low. Denies anhedonia. Some difficulty with concentration. Denies SI.   Feels that Cymbalta is not as effective as it was initially.   Reports that he is now over 100 days sober.    Past Psychiatric Medication Trials: Cymbalta Prozac- Initially effective and then no longer as effective. Felt tired Lexapro- Drowsiness Effexor- Seemed to have helped initially and then started drinking and taking less consistently.  Propranolol-May have been effective for anxiety Librium- prescribed during detox.  Trazodone Vistaril- ineffective Ambien May have taken Adderall in school.   Review of Systems:  Review of Systems  Musculoskeletal: Negative for gait problem.  Neurological: Negative for headaches.       Occ feels shaky  Psychiatric/Behavioral:       Please refer to HPI   Had recent physical exam. Reports labs were WNL with the exception of elevated cholesterol.   Medications: I have reviewed the patient's current medications.  Current Outpatient Medications  Medication Sig Dispense Refill  . DULoxetine (CYMBALTA) 30 MG capsule Take 1  capsule (30 mg total) by mouth daily for 7 days. Then stop 7 capsule 0  . Melatonin 3 MG TABS Take by mouth.    . Multiple Vitamin (MULTIVITAMIN) tablet Take 1 tablet by mouth daily.    . Omega-3 Fatty Acids (FISH OIL PO) Take by mouth.    . propranolol (INDERAL) 10 MG tablet Take 1-2 tabs po BID prn anxiety 120 tablet 1  . [START ON 09/30/2019] desvenlafaxine (PRISTIQ) 100 MG 24 hr tablet Take 1 tablet (100 mg total) by mouth daily. 30 tablet 1  . desvenlafaxine (PRISTIQ) 50 MG 24 hr tablet Take 1 tab po q am for one week, then increase to 2 tabs po q am 30 tablet 0   No current facility-administered medications for this visit.     Medication Side Effects: Fatigue  Allergies: No Known Allergies  Past Medical History:  Diagnosis Date  . Alcoholism ([place])   . Anxiety   . Broken arm   . Depression   . GERD (gastroesophageal reflux disease)   . Panic attacks     History reviewed. No pertinent family history.  Social History   Socioeconomic History  . Marital status: Single    Spouse name: Not on file  . Number of children: Not on file  . Years of education: Not on file  . Highest education level: Not on file  Occupational History  . Not on file  Social Needs  . Financial resource strain: Not on file  . Food insecurity    Worry: Not on file    Inability: Not on file  . Transportation needs  Medical: Not on file    Non-medical: Not on file  Tobacco Use  . Smoking status: Former Games developer  . Smokeless tobacco: Current User    Types: Chew  Substance and Sexual Activity  . Alcohol use: Not Currently  . Drug use: No  . Sexual activity: Yes    Birth control/protection: None  Lifestyle  . Physical activity    Days per week: Not on file    Minutes per session: Not on file  . Stress: Not on file  Relationships  . Social Musician on phone: Not on file    Gets together: Not on file    Attends religious service: Not on file    Active member of club or  organization: Not on file    Attends meetings of clubs or organizations: Not on file    Relationship status: Not on file  . Intimate partner violence    Fear of current or ex partner: Not on file    Emotionally abused: Not on file    Physically abused: Not on file    Forced sexual activity: Not on file  Other Topics Concern  . Not on file  Social History Narrative  . Not on file    Past Medical History, Surgical history, Social history, and Family history were reviewed and updated as appropriate.   Please see review of systems for further details on the patient's review from today.   Objective:   Physical Exam:  There were no vitals taken for this visit.  Physical Exam Constitutional:      General: He is not in acute distress.    Appearance: He is well-developed.  Musculoskeletal:        General: No deformity.  Neurological:     Mental Status: He is alert and oriented to person, place, and time.     Coordination: Coordination normal.  Psychiatric:        Attention and Perception: Attention and perception normal. He does not perceive auditory or visual hallucinations.        Mood and Affect: Mood is depressed. Mood is not anxious. Affect is blunt. Affect is not labile, angry or inappropriate.        Speech: Speech normal.        Behavior: Behavior normal.        Thought Content: Thought content normal. Thought content is not paranoid or delusional. Thought content does not include homicidal or suicidal ideation. Thought content does not include homicidal or suicidal plan.        Cognition and Memory: Cognition and memory normal.        Judgment: Judgment normal.     Comments: Insight intact. No delusions.  Decreased eye contact.  Patient frequently looking down at his phone.     Lab Review:     Component Value Date/Time   NA 140 03/16/2014 1225   K 4.0 03/16/2014 1225   CL 97 03/16/2014 1225   CO2 26 03/16/2014 1225   GLUCOSE 126 (H) 03/16/2014 1225   BUN 14  03/16/2014 1225   CREATININE 0.96 03/16/2014 1225   CALCIUM 9.3 03/16/2014 1225   PROT 8.2 03/16/2014 1225   ALBUMIN 4.7 03/16/2014 1225   AST 63 (H) 03/16/2014 1225   ALT 60 (H) 03/16/2014 1225   ALKPHOS 99 03/16/2014 1225   BILITOT 1.5 (H) 03/16/2014 1225   GFRNONAA >90 03/16/2014 1225   GFRAA >90 03/16/2014 1225       Component Value Date/Time  WBC 4.4 03/16/2014 1225   RBC 5.57 03/16/2014 1225   HGB 17.3 (H) 03/16/2014 1225   HCT 48.6 03/16/2014 1225   PLT 239 03/16/2014 1225   MCV 87.3 03/16/2014 1225   MCH 31.1 03/16/2014 1225   MCHC 35.6 03/16/2014 1225   RDW 12.1 03/16/2014 1225   LYMPHSABS 1.7 03/16/2014 1225   MONOABS 0.3 03/16/2014 1225   EOSABS 0.0 03/16/2014 1225   BASOSABS 0.0 03/16/2014 1225    No results found for: POCLITH, LITHIUM   No results found for: PHENYTOIN, PHENOBARB, VALPROATE, CBMZ   .res Assessment: Plan:   Patient seen for 30 minutes and greater than 50% of visit spent counseling patient regarding possible treatment options to include augmentation with Wellbutrin XL or increasing Cymbalta to 90 mg daily, however patient has some concerns that Cymbalta may be causing some fatigue.  Patient reports that he prefers to take only one medication as opposed to adding medication for augmentation of depression, and would therefore like to switch Cymbalta to an alternative medication.  Discussed potential benefits, risks, and side effects of Pristiq and discussed that the risk of fatigue with Pristiq is very low and therefore tolerability with Pristiq would likely be better compared to other medications since fatigue has been the most common side effect he has had with other medications. Discussed cross titration from Cymbalta to Pristiq.  Will decrease Cymbalta to 30 mg p.o. every morning for 1 week and then stop.  Will start Pristiq 50 mg p.o. every morning for 1 week, then increase to 100 mg daily for anxiety and depression. Patient to follow-up in 4 to 6  weeks or sooner if clinically indicated. Patient advised to contact office with any questions, adverse effects, or acute worsening in signs and symptoms.  Edward NeedleMichael was seen today for depression, anxiety and add.  Diagnoses and all orders for this visit:  Moderate episode of recurrent major depressive disorder (HCC) -     DULoxetine (CYMBALTA) 30 MG capsule; Take 1 capsule (30 mg total) by mouth daily for 7 days. Then stop -     desvenlafaxine (PRISTIQ) 100 MG 24 hr tablet; Take 1 tablet (100 mg total) by mouth daily.  Anxiety -     DULoxetine (CYMBALTA) 30 MG capsule; Take 1 capsule (30 mg total) by mouth daily for 7 days. Then stop -     desvenlafaxine (PRISTIQ) 50 MG 24 hr tablet; Take 1 tab po q am for one week, then increase to 2 tabs po q am -     desvenlafaxine (PRISTIQ) 100 MG 24 hr tablet; Take 1 tablet (100 mg total) by mouth daily.  Recurrent major depressive disorder, in full remission [facility](HCC)     Please see After Visit Summary for patient specific instructions.  Future Appointments  Date Time Provider Department Center  10/22/2019 11:00 AM Corie Chiquitoarter, Sven Pinheiro, PMHNP [facility] None    No orders of the defined types were placed in this encounter.   -------------------------------

## 2019-09-24 ENCOUNTER — Telehealth: Payer: Self-pay | Admitting: Psychiatry

## 2019-09-24 DIAGNOSIS — F331 Major depressive disorder, recurrent, moderate: Secondary | ICD-10-CM

## 2019-09-24 DIAGNOSIS — F419 Anxiety disorder, unspecified: Secondary | ICD-10-CM

## 2019-09-24 NOTE — Telephone Encounter (Signed)
Pt called to report Pristiq was too expensive $125 monthly even tried [facility]. Ask for some other med, like Effexor [facility] said. [facility] [address]

## 2019-09-25 MED ORDER — DULOXETINE HCL 30 MG PO CPEP: ORAL_CAPSULE | ORAL | 0 refills | Status: DC

## 2019-09-25 MED ORDER — VENLAFAXINE HCL ER 37.5 MG PO CP24: ORAL_CAPSULE | ORAL | 0 refills | Status: DC

## 2019-09-25 NOTE — Addendum Note (Signed)
Addended by: Sharyl Nimrod on: 09/25/2019 03:43 PM   Modules accepted: Orders

## 2019-09-26 NOTE — Telephone Encounter (Signed)
Patient given all the information and instructions, verbalized understanding. Instructed to call back with any concerns or questions.

## 2019-10-17 ENCOUNTER — Other Ambulatory Visit: Payer: Self-pay | Admitting: Otolaryngology

## 2019-10-17 DIAGNOSIS — R42 Dizziness and giddiness: Secondary | ICD-10-CM

## 2019-10-17 DIAGNOSIS — T1590XA Foreign body on external eye, part unspecified, unspecified eye, initial encounter: Secondary | ICD-10-CM

## 2019-10-21 ENCOUNTER — Other Ambulatory Visit: Payer: Self-pay | Admitting: Psychiatry

## 2019-10-21 DIAGNOSIS — F331 Major depressive disorder, recurrent, moderate: Secondary | ICD-10-CM

## 2019-10-21 DIAGNOSIS — F419 Anxiety disorder, unspecified: Secondary | ICD-10-CM

## 2019-10-21 NOTE — Telephone Encounter (Signed)
Apt tomorrow 12/16, refills not due yet.

## 2019-10-22 ENCOUNTER — Ambulatory Visit (INDEPENDENT_AMBULATORY_CARE_PROVIDER_SITE_OTHER): Payer: 59 | Admitting: Psychiatry

## 2019-10-22 ENCOUNTER — Encounter: Payer: Self-pay | Admitting: Psychiatry

## 2019-10-22 DIAGNOSIS — F419 Anxiety disorder, unspecified: Secondary | ICD-10-CM

## 2019-10-22 DIAGNOSIS — F331 Major depressive disorder, recurrent, moderate: Secondary | ICD-10-CM | POA: Diagnosis not present

## 2019-10-22 MED ORDER — DULOXETINE HCL 60 MG PO CPEP: 60.0000 mg | ORAL_CAPSULE | Freq: Every day | ORAL | 0 refills | Status: DC

## 2019-10-22 NOTE — Progress Notes (Signed)
Edward ShapeMichael A Florance [id number] [birth year] 32 y.o.  Virtual Visit via Telephone Note  I connected with pt on 10/22/19 at 11:00 AM EST by telephone and verified that I am speaking with the correct person using two identifiers.   I discussed the limitations, risks, security and privacy concerns of performing an evaluation and management service by telephone and the availability of in person appointments. I also discussed with the patient that there may be a patient responsible charge related to this service. The patient expressed understanding and agreed to proceed.   I discussed the assessment and treatment plan with the patient. The patient was provided an opportunity to ask questions and all were answered. The patient agreed with the plan and demonstrated an understanding of the instructions.   The patient was advised to call back or seek an in-person evaluation if the symptoms worsen or if the condition fails to improve as anticipated.  I provided 15 minutes of non-face-to-face time during this encounter.  The patient was located at home.  [facility]   Corie ChiquitoJessica Myrene Bougher, PMHNP   Subjective:   Patient ID:  Edward Singh is a 33 y.o. (DOB [birth year]) male.  Chief Complaint:  Chief Complaint  Patient presents with  . Follow-up    Anxiety, Depression    HPI Edward ShapeMichael A Guillen presents for follow-up of depression and anxiety. He reports that he decided to continue Cymbalta since it seems to be working ok at 60 mg. He reports that his mood has been "ok" and denies depressed mood. Anxiety has been "low." He reports that he has been staying sober and that this seems to be helping with his anxiety. Sleeping well. Energy and motivation have improved but remain somewhat low. He reports that he has been able to do things that he needs to do. Appetite has been stable. Concentration has been adequate. Denies SI.   May have taken Propranolol prn a few  weeks ago.   [facility]   Past Psychiatric Medication Trials: Cymbalta Prozac- Initially effective and then no longer as effective. Felt tired Lexapro- Drowsiness Effexor- Seemed to have helped initially and then started drinking and taking less consistently.  Propranolol-May have been effective for anxiety Librium- prescribed during detox.  Trazodone Vistaril- ineffective Ambien May have taken Adderall in school.   Review of Systems:  Review of Systems  Musculoskeletal: Negative for gait problem.  Neurological: Negative for tremors.  Psychiatric/Behavioral:       Please refer to HPI    Medications: Reviewed  Current Outpatient Medications  Medication Sig Dispense Refill  . DULoxetine (CYMBALTA) 60 MG capsule Take 1 capsule (60 mg total) by mouth daily. Take 1 capsule po q am for 10-14 days, then stop 90 capsule 0  . Melatonin 3 MG TABS Take by mouth at bedtime as needed.     . Multiple Vitamin (MULTIVITAMIN) tablet Take 1 tablet by mouth daily.    . Omega-3 Fatty Acids (FISH OIL PO) Take by mouth.    . propranolol (INDERAL) 10 MG tablet Take 1-2 tabs po BID prn anxiety 120 tablet 1   No current facility-administered medications for this visit.    Medication Side Effects: None  Allergies: No Known Allergies  Past Medical History:  Diagnosis Date  . Alcoholism (HCC)   . Anxiety   . Broken arm   . Depression   . GERD (gastroesophageal reflux disease)   . Panic attacks  History reviewed. No pertinent family history.  Social History   Socioeconomic History  . Marital status: Single    Spouse name: Not on file  . Number of children: Not on file  . Years of education: Not on file  . Highest education level: Not on file  Occupational History  . Not on file  Tobacco Use  . Smoking status: Former Research scientist (life sciences)  . Smokeless tobacco: Current User    Types: Chew  Substance and Sexual Activity  . Alcohol use: Not Currently  . Drug  use: No  . Sexual activity: Yes    Birth control/protection: None  Other Topics Concern  . Not on file  Social History Narrative  . Not on file   Social Determinants of Health   Financial Resource Strain:   . Difficulty of Paying Living Expenses: Not on file  Food Insecurity:   . Worried About Charity fundraiser in the Last Year: Not on file  . Ran Out of Food in the Last Year: Not on file  Transportation Needs:   . Lack of Transportation (Medical): Not on file  . Lack of Transportation (Non-Medical): Not on file  Physical Activity:   . Days of Exercise per Week: Not on file  . Minutes of Exercise per Session: Not on file  Stress:   . Feeling of Stress : Not on file  Social Connections:   . Frequency of Communication with Friends and Family: Not on file  . Frequency of Social Gatherings with Friends and Family: Not on file  . Attends Religious Services: Not on file  . Active Member of Clubs or Organizations: Not on file  . Attends Archivist Meetings: Not on file  . Marital Status: Not on file  Intimate Partner Violence:   . Fear of Current or Ex-Partner: Not on file  . Emotionally Abused: Not on file  . Physically Abused: Not on file  . Sexually Abused: Not on file    Past Medical History, Surgical history, Social history, and Family history were reviewed and updated as appropriate.   Please see review of systems for further details on the patient's review from today.   Objective:   Physical Exam:  There were no vitals taken for this visit.  Physical Exam Neurological:     Mental Status: He is alert and oriented to person, place, and time.     Cranial Nerves: No dysarthria.  Psychiatric:        Attention and Perception: Attention and perception normal.        Mood and Affect: Mood normal.        Speech: Speech normal.        Behavior: Behavior is cooperative.        Thought Content: Thought content normal. Thought content is not paranoid or  delusional. Thought content does not include homicidal or suicidal ideation. Thought content does not include homicidal or suicidal plan.        Cognition and Memory: Cognition and memory normal.        Judgment: Judgment normal.     Comments: Insight intact     Lab Review:     Component Value Date/Time   NA 140 03/16/2014 1225   K 4.0 03/16/2014 1225   CL 97 03/16/2014 1225   CO2 26 03/16/2014 1225   GLUCOSE 126 (H) 03/16/2014 1225   BUN 14 03/16/2014 1225   CREATININE 0.96 03/16/2014 1225   CALCIUM 9.3 03/16/2014 1225   PROT 8.2  03/16/2014 1225   ALBUMIN 4.7 03/16/2014 1225   AST 63 (H) 03/16/2014 1225   ALT 60 (H) 03/16/2014 1225   ALKPHOS 99 03/16/2014 1225   BILITOT 1.5 (H) 03/16/2014 1225   GFRNONAA >90 03/16/2014 1225   GFRAA >90 03/16/2014 1225       Component Value Date/Time   WBC 4.4 03/16/2014 1225   RBC 5.57 03/16/2014 1225   HGB 17.3 (H) 03/16/2014 1225   HCT 48.6 03/16/2014 1225   PLT 239 03/16/2014 1225   MCV 87.3 03/16/2014 1225   MCH 31.1 03/16/2014 1225   MCHC 35.6 03/16/2014 1225   RDW 12.1 03/16/2014 1225   LYMPHSABS 1.7 03/16/2014 1225   MONOABS 0.3 03/16/2014 1225   EOSABS 0.0 03/16/2014 1225   BASOSABS 0.0 03/16/2014 1225    No results found for: POCLITH, LITHIUM   No results found for: PHENYTOIN, PHENOBARB, VALPROATE, CBMZ   .res Assessment: Plan:   Will continue Cymbalta 60 mg daily since patient reports some recent movement in his mood and anxiety and prefers to continue Cymbalta 60 mg at this time.  He agrees to contact office with any worsening signs and symptoms. Patient to follow-up in 3 months or sooner if clinically indicated. Patient advised to contact office with any questions, adverse effects, or acute worsening in signs and symptoms.  Khoury was seen today for follow-up.  Diagnoses and all orders for this visit:  Anxiety -     DULoxetine (CYMBALTA) 60 MG capsule; Take 1 capsule (60 mg total) by mouth daily. Take 1  capsule po q am for 10-14 days, then stop  Moderate episode of recurrent major depressive disorder (HCC) -     DULoxetine (CYMBALTA) 60 MG capsule; Take 1 capsule (60 mg total) by mouth daily. Take 1 capsule po q am for 10-14 days, then stop    Please see After Visit Summary for patient specific instructions.  Future Appointments  Date Time Provider Department Center  11/06/2019 11:15 AM [facility] DG 1 [facility]  11/06/2019 12:30 PM [facility] MR 1 [facility]    No orders of the defined types were placed in this encounter.     -------------------------------

## 2019-11-06 ENCOUNTER — Ambulatory Visit
Admission: RE | Admit: 2019-11-06 | Discharge: 2019-11-06 | Disposition: A | Discharge status: Home / Self Care | Payer: 59 | Source: Ambulatory Visit | Attending: Otolaryngology | Admitting: Otolaryngology

## 2019-11-06 ENCOUNTER — Other Ambulatory Visit: Payer: Self-pay

## 2019-11-06 DIAGNOSIS — T1590XA Foreign body on external eye, part unspecified, unspecified eye, initial encounter: Secondary | ICD-10-CM

## 2019-11-06 DIAGNOSIS — R42 Dizziness and giddiness: Secondary | ICD-10-CM

## 2019-11-06 MED ORDER — GADOBENATE DIMEGLUMINE 529 MG/ML IV SOLN
19.0000 mL | Freq: Once | INTRAVENOUS | Status: AC | PRN
  Administered 2019-11-06: 19 mL via INTRAVENOUS

## 2020-05-12 ENCOUNTER — Other Ambulatory Visit: Payer: Self-pay | Admitting: Psychiatry

## 2020-05-12 DIAGNOSIS — F331 Major depressive disorder, recurrent, moderate: Secondary | ICD-10-CM

## 2020-05-12 DIAGNOSIS — F419 Anxiety disorder, unspecified: Secondary | ICD-10-CM

## 2020-06-08 ENCOUNTER — Other Ambulatory Visit: Payer: Self-pay | Admitting: Psychiatry

## 2020-06-08 DIAGNOSIS — F331 Major depressive disorder, recurrent, moderate: Secondary | ICD-10-CM

## 2020-06-08 DIAGNOSIS — F419 Anxiety disorder, unspecified: Secondary | ICD-10-CM

## 2020-06-11 ENCOUNTER — Other Ambulatory Visit: Payer: Self-pay | Admitting: Psychiatry

## 2020-06-11 DIAGNOSIS — F331 Major depressive disorder, recurrent, moderate: Secondary | ICD-10-CM

## 2020-06-11 DIAGNOSIS — F419 Anxiety disorder, unspecified: Secondary | ICD-10-CM

## 2020-06-30 ENCOUNTER — Telehealth: Payer: Self-pay | Admitting: Psychiatry

## 2020-06-30 DIAGNOSIS — F331 Major depressive disorder, recurrent, moderate: Secondary | ICD-10-CM

## 2020-06-30 DIAGNOSIS — F419 Anxiety disorder, unspecified: Secondary | ICD-10-CM

## 2020-06-30 MED ORDER — DULOXETINE HCL 60 MG PO CPEP: 60.0000 mg | ORAL_CAPSULE | Freq: Every day | ORAL | 0 refills | Status: DC

## 2020-06-30 NOTE — Telephone Encounter (Signed)
Pt called back and made appt for 9/3

## 2020-06-30 NOTE — Telephone Encounter (Signed)
Kyan's grandmother called requesting a refill for Duloxetine on his behalf. Would like pharmacy location switched to [facility] [address] until further notice. Last seen 10/22/2019. No future appts on file.

## 2020-06-30 NOTE — Telephone Encounter (Signed)
According to last Rx, he was to taper off medication?

## 2020-07-01 ENCOUNTER — Other Ambulatory Visit: Payer: Self-pay

## 2020-07-01 ENCOUNTER — Telehealth: Payer: Self-pay | Admitting: Psychiatry

## 2020-07-01 DIAGNOSIS — F419 Anxiety disorder, unspecified: Secondary | ICD-10-CM

## 2020-07-01 DIAGNOSIS — F331 Major depressive disorder, recurrent, moderate: Secondary | ICD-10-CM

## 2020-07-01 MED ORDER — DULOXETINE HCL 60 MG PO CPEP: 60.0000 mg | ORAL_CAPSULE | Freq: Every day | ORAL | 0 refills | Status: DC

## 2020-07-01 NOTE — Telephone Encounter (Signed)
Pt called requesting refill on Cymbalta called to [facility] on [address] [place], [place]. Has appt on 07/09/20.

## 2020-07-01 NOTE — Telephone Encounter (Signed)
[facility] requesting to [facility] to [facility]. Will send Rx as submitted yesterday.

## 2020-07-09 ENCOUNTER — Other Ambulatory Visit: Payer: Self-pay

## 2020-07-09 ENCOUNTER — Telehealth (INDEPENDENT_AMBULATORY_CARE_PROVIDER_SITE_OTHER): Payer: 59 | Admitting: Psychiatry

## 2020-07-09 ENCOUNTER — Encounter: Payer: Self-pay | Admitting: Psychiatry

## 2020-07-09 DIAGNOSIS — F331 Major depressive disorder, recurrent, moderate: Secondary | ICD-10-CM

## 2020-07-09 DIAGNOSIS — F419 Anxiety disorder, unspecified: Secondary | ICD-10-CM

## 2020-07-09 MED ORDER — DULOXETINE HCL 60 MG PO CPEP: 60.0000 mg | ORAL_CAPSULE | Freq: Every day | ORAL | 2 refills | Status: DC

## 2020-07-09 NOTE — Progress Notes (Signed)
Edward Singh [id number] 21-Aug-1986 34 y.o.  Virtual Visit via Telephone Note  I connected with pt on 07/09/20 at  9:00 AM EDT by telephone and verified that I am speaking with the correct person using two identifiers.   I discussed the limitations, risks, security and privacy concerns of performing an evaluation and management service by telephone and the availability of in person appointments. I also discussed with the patient that there may be a patient responsible charge related to this service. The patient expressed understanding and agreed to proceed.   I discussed the assessment and treatment plan with the patient. The patient was provided an opportunity to ask questions and all were answered. The patient agreed with the plan and demonstrated an understanding of the instructions.   The patient was advised to call back or seek an in-person evaluation if the symptoms worsen or if the condition fails to improve as anticipated.  I provided 15 minutes of non-face-to-face time during this encounter.  The patient was located at home.  The provider was located at [facility].   Corie Chiquito, PMHNP   Subjective:   Patient ID:  Edward Singh is a 34 y.o. (DOB 1986-07-04) male.  Chief Complaint:  Chief Complaint  Patient presents with  . Follow-up    h/o anxiety, depression, ETOH misuse    HPI ISAO SELTZER presents for follow-up of anxiety, depression, alcohol misuse. He reports that he has continued on Cymbalta 60 mg po qd and has been taking it at night and this seems to be working well for him. Denies depressed mood. Denies anxiety. He has not had any irritability. Sleeping well. Appetite has been good. Energy and motivation have been ok. Concentration has been adequate. Denies SI.   Daughter just turned 76 years old.  Last ETOH use over a year ago. Saw Bennie Dallas, [facility] and had to stop treatment due to insurance non-coverage.   Past Psychiatric  Medication Trials: Cymbalta Prozac- Initially effective and then no longer as effective. Felt tired Lexapro- Drowsiness Effexor- Seemed to have helped initially and then started drinking and taking less consistently. Propranolol-May have been effective for anxiety Librium- prescribed during detox.  Trazodone Vistaril- ineffective Ambien May have taken Adderall in school.  Review of Systems:  Review of Systems  Musculoskeletal: Negative for gait problem.  Neurological: Negative for tremors.  Psychiatric/Behavioral:       Please refer to HPI    Medications: I have reviewed the patient's current medications.  Current Outpatient Medications  Medication Sig Dispense Refill  . amLODipine (NORVASC) 10 MG tablet Take 10 mg by mouth daily.    . DULoxetine (CYMBALTA) 60 MG capsule Take 1 capsule (60 mg total) by mouth daily. 90 capsule 2  . Melatonin 3 MG TABS Take by mouth at bedtime as needed.     . Multiple Vitamin (MULTIVITAMIN) tablet Take 1 tablet by mouth daily.    . Omega-3 Fatty Acids (FISH OIL PO) Take by mouth.     No current facility-administered medications for this visit.    Medication Side Effects: None  Allergies: No Known Allergies  Past Medical History:  Diagnosis Date  . Alcoholism (HCC)   . Anxiety   . Broken arm   . Depression   . GERD (gastroesophageal reflux disease)   . High cholesterol   . Hypertension   . Panic attacks     History reviewed. No pertinent family history.  Social History   Socioeconomic History  . Marital status:  Single    Spouse name: Not on file  . Number of children: Not on file  . Years of education: Not on file  . Highest education level: Not on file  Occupational History  . Not on file  Tobacco Use  . Smoking status: Former Games developer  . Smokeless tobacco: Current User    Types: Chew  Substance and Sexual Activity  . Alcohol use: Not Currently  . Drug use: No  . Sexual activity: Yes    Birth control/protection: None   Other Topics Concern  . Not on file  Social History Narrative  . Not on file   Social Determinants of Health   Financial Resource Strain:   . Difficulty of Paying Living Expenses: Not on file  Food Insecurity:   . Worried About Programme researcher, broadcasting/film/video in the Last Year: Not on file  . Ran Out of Food in the Last Year: Not on file  Transportation Needs:   . Lack of Transportation (Medical): Not on file  . Lack of Transportation (Non-Medical): Not on file  Physical Activity:   . Days of Exercise per Week: Not on file  . Minutes of Exercise per Session: Not on file  Stress:   . Feeling of Stress : Not on file  Social Connections:   . Frequency of Communication with Friends and Family: Not on file  . Frequency of Social Gatherings with Friends and Family: Not on file  . Attends Religious Services: Not on file  . Active Member of Clubs or Organizations: Not on file  . Attends Banker Meetings: Not on file  . Marital Status: Not on file  Intimate Partner Violence:   . Fear of Current or Ex-Partner: Not on file  . Emotionally Abused: Not on file  . Physically Abused: Not on file  . Sexually Abused: Not on file    Past Medical History, Surgical history, Social history, and Family history were reviewed and updated as appropriate.   Please see review of systems for further details on the patient's review from today.   Objective:   Physical Exam:  There were no vitals taken for this visit.  Physical Exam Neurological:     Mental Status: He is alert and oriented to person, place, and time.     Cranial Nerves: No dysarthria.  Psychiatric:        Attention and Perception: Attention and perception normal.        Mood and Affect: Mood normal.        Speech: Speech normal.        Behavior: Behavior is cooperative.        Thought Content: Thought content normal. Thought content is not paranoid or delusional. Thought content does not include homicidal or suicidal ideation.  Thought content does not include homicidal or suicidal plan.        Cognition and Memory: Cognition and memory normal.        Judgment: Judgment normal.     Comments: Insight intact     Lab Review:     Component Value Date/Time   NA 140 03/16/2014 1225   K 4.0 03/16/2014 1225   CL 97 03/16/2014 1225   CO2 26 03/16/2014 1225   GLUCOSE 126 (H) 03/16/2014 1225   BUN 14 03/16/2014 1225   CREATININE 0.96 03/16/2014 1225   CALCIUM 9.3 03/16/2014 1225   PROT 8.2 03/16/2014 1225   ALBUMIN 4.7 03/16/2014 1225   AST 63 (H) 03/16/2014 1225  ALT 60 (H) 03/16/2014 1225   ALKPHOS 99 03/16/2014 1225   BILITOT 1.5 (H) 03/16/2014 1225   GFRNONAA >90 03/16/2014 1225   GFRAA >90 03/16/2014 1225       Component Value Date/Time   WBC 4.4 03/16/2014 1225   RBC 5.57 03/16/2014 1225   HGB 17.3 (H) 03/16/2014 1225   HCT 48.6 03/16/2014 1225   PLT 239 03/16/2014 1225   MCV 87.3 03/16/2014 1225   MCH 31.1 03/16/2014 1225   MCHC 35.6 03/16/2014 1225   RDW 12.1 03/16/2014 1225   LYMPHSABS 1.7 03/16/2014 1225   MONOABS 0.3 03/16/2014 1225   EOSABS 0.0 03/16/2014 1225   BASOSABS 0.0 03/16/2014 1225    No results found for: POCLITH, LITHIUM   No results found for: PHENYTOIN, PHENOBARB, VALPROATE, CBMZ   .res Assessment: Plan:   Continue Cymbalta 60 mg daily for depression and anxiety.  Pt to follow- up in 9 months or sooner if clinically indicated.  Patient advised to contact office with any questions, adverse effects, or acute worsening in signs and symptoms.  Davaris was seen today for follow-up.  Diagnoses and all orders for this visit:  Anxiety -     DULoxetine (CYMBALTA) 60 MG capsule; Take 1 capsule (60 mg total) by mouth daily.  Moderate episode of recurrent major depressive disorder (HCC) -     DULoxetine (CYMBALTA) 60 MG capsule; Take 1 capsule (60 mg total) by mouth daily.    Please see After Visit Summary for patient specific instructions.  No future  appointments.  No orders of the defined types were placed in this encounter.     -------------------------------

## 2020-11-26 ENCOUNTER — Encounter (HOSPITAL_COMMUNITY): Payer: Self-pay

## 2020-11-26 ENCOUNTER — Emergency Department (HOSPITAL_COMMUNITY)
Admission: EM | Admit: 2020-11-26 | Discharge: 2020-11-26 | Disposition: A | Discharge status: Home / Self Care | Payer: 59 | Attending: Emergency Medicine | Admitting: Emergency Medicine

## 2020-11-26 ENCOUNTER — Other Ambulatory Visit: Payer: Self-pay

## 2020-11-26 DIAGNOSIS — K219 Gastro-esophageal reflux disease without esophagitis: Secondary | ICD-10-CM | POA: Insufficient documentation

## 2020-11-26 DIAGNOSIS — Z87891 Personal history of nicotine dependence: Secondary | ICD-10-CM | POA: Insufficient documentation

## 2020-11-26 DIAGNOSIS — Z79899 Other long term (current) drug therapy: Secondary | ICD-10-CM | POA: Insufficient documentation

## 2020-11-26 DIAGNOSIS — I10 Essential (primary) hypertension: Secondary | ICD-10-CM | POA: Diagnosis not present

## 2020-11-26 DIAGNOSIS — U071 COVID-19: Secondary | ICD-10-CM | POA: Diagnosis not present

## 2020-11-26 DIAGNOSIS — R112 Nausea with vomiting, unspecified: Secondary | ICD-10-CM | POA: Diagnosis not present

## 2020-11-26 DIAGNOSIS — R1013 Epigastric pain: Secondary | ICD-10-CM | POA: Insufficient documentation

## 2020-11-26 DIAGNOSIS — R059 Cough, unspecified: Secondary | ICD-10-CM | POA: Diagnosis present

## 2020-11-26 LAB — I-STAT CHEM 8, ED
BUN: 11 mg/dL (ref 6–20)
Calcium, Ion: 1.1 mmol/L — ABNORMAL LOW (ref 1.15–1.40)
Chloride: 101 mmol/L (ref 98–111)
Creatinine, Ser: 0.8 mg/dL (ref 0.61–1.24)
Glucose, Bld: 106 mg/dL — ABNORMAL HIGH (ref 70–99)
HCT: 44 % (ref 39.0–52.0)
Hemoglobin: 15 g/dL (ref 13.0–17.0)
Potassium: 3.7 mmol/L (ref 3.5–5.1)
Sodium: 136 mmol/L (ref 135–145)
TCO2: 23 mmol/L (ref 22–32)

## 2020-11-26 MED ORDER — ONDANSETRON 4 MG PO TBDP: ORAL_TABLET | ORAL | 0 refills | Status: DC

## 2020-11-26 MED ORDER — ONDANSETRON HCL 4 MG/2ML IJ SOLN
4.0000 mg | Freq: Once | INTRAMUSCULAR | Status: AC
  Administered 2020-11-26: 4 mg via INTRAVENOUS
  Filled 2020-11-26: qty 2

## 2020-11-26 MED ORDER — ALUM & MAG HYDROXIDE-SIMETH 200-200-20 MG/5ML PO SUSP
30.0000 mL | Freq: Once | ORAL | Status: AC
  Administered 2020-11-26: 30 mL via ORAL
  Filled 2020-11-26: qty 30

## 2020-11-26 MED ORDER — CHLORDIAZEPOXIDE HCL 25 MG PO CAPS
100.0000 mg | ORAL_CAPSULE | Freq: Once | ORAL | Status: AC
  Administered 2020-11-26: 100 mg via ORAL
  Filled 2020-11-26: qty 4

## 2020-11-26 MED ORDER — SODIUM CHLORIDE 0.9 % IV BOLUS
1000.0000 mL | Freq: Once | INTRAVENOUS | Status: AC
  Administered 2020-11-26: 1000 mL via INTRAVENOUS

## 2020-11-26 MED ORDER — PANTOPRAZOLE SODIUM 20 MG PO TBEC: 20.0000 mg | DELAYED_RELEASE_TABLET | Freq: Every day | ORAL | 0 refills | Status: DC

## 2020-11-26 MED ORDER — BENZONATATE 100 MG PO CAPS: 100.0000 mg | ORAL_CAPSULE | Freq: Three times a day (TID) | ORAL | 0 refills | Status: DC

## 2020-11-26 MED ORDER — FAMOTIDINE IN NACL 20-0.9 MG/50ML-% IV SOLN
20.0000 mg | Freq: Once | INTRAVENOUS | Status: AC
  Administered 2020-11-26: 20 mg via INTRAVENOUS
  Filled 2020-11-26: qty 50

## 2020-11-26 NOTE — ED Triage Notes (Signed)
Patientnt reports that he has been having Covid symptoms x 8-9 days, but tested Covid + 5 days ago. patientnt c/o SOB ,abdominal pain, vomiting, and slight diarrhea.

## 2020-11-26 NOTE — ED Provider Notes (Signed)
Edward Singh [facility]ENC[facility]PT Provider Note   CSN: [id number] Arrival date & time: 11/26/20  1030     History Chief Complaint  Patient presents with   Covid Positive    Edward Singh is a 35 y.o. male.  35 yo M with a chief complaints of COVID-19 infection.  Patient has been sick for about 8 or 9 days now.  Having cough but his biggest problem is that he has been having trouble with nausea and vomiting over the past 3 days.  Unable to take anything and even water.  Feels like he is extremely dehydrated and would like a bag of IV fluids.  Has some burning to his epigastrium that is similar to his prior reflux.  Otherwise denies abdominal pain.  The history is provided by the patient.  Illness Severity:  Moderate Onset quality:  Gradual Duration:  8 days Timing:  Constant Progression:  Worsening Chronicity:  New Associated symptoms: abdominal pain, nausea, shortness of breath and vomiting   Associated symptoms: no chest pain, no congestion, no diarrhea, no fever, no headaches, no myalgias and no rash        Past Medical History:  Diagnosis Date   Alcoholism (HCC)    Anxiety    Broken arm    Depression    GERD (gastroesophageal reflux disease)    High cholesterol    Hypertension    Panic attacks     Patient Active Problem List   Diagnosis Date Noted   Alcohol dependence (HCC) 03/16/2014   Anxiety 03/12/2014    Past Surgical History:  Procedure Laterality Date   WISDOM TOOTH EXTRACTION         Family History  Problem Relation Age of Onset   Healthy Mother    Healthy Father     Social History   Tobacco Use   Smoking status: Former Smoker   Smokeless tobacco: Former Neurosurgeon    Types: Associate Professor Use: Never used  Substance Use Topics   Alcohol use: Not Currently   Drug use: No    Home Medications Prior to Admission medications   Medication Sig Start Date End Date Taking? Authorizing Provider   acetaminophen (TYLENOL) 500 MG tablet Take 1,000 mg by mouth every 6 (six) hours as needed for mild pain, fever or headache.   Yes [provider]  amLODipine (NORVASC) 10 MG tablet Take 10 mg by mouth daily. 05/14/20  Yes [provider]  benzonatate (TESSALON) 100 MG capsule Take 1 capsule (100 mg total) by mouth every 8 (eight) hours. 11/26/20  Yes Melene Plan, DO  DULoxetine (CYMBALTA) 60 MG capsule Take 1 capsule (60 mg total) by mouth daily. 07/09/20  Yes Corie Chiquito, PMHNP  ondansetron (ZOFRAN ODT) 4 MG disintegrating tablet 4mg  ODT q4 hours prn nausea/vomit 11/26/20  Yes 11/28/20, DO  pantoprazole (PROTONIX) 20 MG tablet Take 1 tablet (20 mg total) by mouth daily. 11/26/20  Yes 11/28/20, DO    Allergies    Patient has no known allergies.  Review of Systems   Review of Systems  Constitutional: Negative for chills and fever.  HENT: Negative for congestion and facial swelling.   Eyes: Negative for discharge and visual disturbance.  Respiratory: Positive for shortness of breath.   Cardiovascular: Negative for chest pain and palpitations.  Gastrointestinal: Positive for abdominal pain, nausea and vomiting. Negative for diarrhea.  Musculoskeletal: Negative for arthralgias and myalgias.  Skin: Negative for color change and rash.  Neurological: Negative for tremors, syncope and headaches.  Psychiatric/Behavioral: Negative for confusion and dysphoric mood.    Physical Exam Updated Vital Signs BP 136/89    Pulse 98    Temp 100.1 F (37.8 C) (Oral)    Resp (!) 24    Ht 5\' 9"  (1.753 m)    Wt 89.1 kg    SpO2 94%    BMI 29.02 kg/m   Physical Exam Vitals and nursing note reviewed.  Constitutional:      Appearance: He is well-developed and well-nourished.  HENT:     Head: Normocephalic and atraumatic.  Eyes:     Extraocular Movements: EOM normal.     Pupils: Pupils are equal, round, and reactive to light.  Neck:     Vascular: No JVD.  Cardiovascular:     Rate  and Rhythm: Normal rate and regular rhythm.     Heart sounds: No murmur heard. No friction rub. No gallop.   Pulmonary:     Effort: No respiratory distress.     Breath sounds: No wheezing.  Abdominal:     General: There is no distension.     Tenderness: There is no abdominal tenderness. There is no guarding or rebound.     Comments: Benign abdominal exam  Musculoskeletal:        General: Normal range of motion.     Cervical back: Normal range of motion and neck supple.  Skin:    Coloration: Skin is not pale.     Findings: No rash.  Neurological:     Mental Status: He is alert and oriented to person, place, and time.  Psychiatric:        Mood and Affect: Mood and affect normal.        Behavior: Behavior normal.     ED Results / Procedures / Treatments   Labs (all labs ordered are listed, but only abnormal results are displayed) Labs Reviewed  I-STAT CHEM 8, ED - Abnormal; Notable for the following components:      Result Value   Glucose, Bld 106 (*)    Calcium, Ion 1.10 (*)    All other components within normal limits    EKG None  Radiology No results found.  Procedures Procedures (including critical care time)  Medications Ordered in ED Medications  sodium chloride 0.9 % bolus 1,000 mL (0 mLs Intravenous Stopped 11/26/20 1426)  ondansetron (ZOFRAN) injection 4 mg (4 mg Intravenous Given 11/26/20 1247)  alum & mag hydroxide-simeth (MAALOX/MYLANTA) 200-200-20 MG/5ML suspension 30 mL (30 mLs Oral Given 11/26/20 1248)  famotidine (PEPCID) IVPB 20 mg premix (0 mg Intravenous Stopped 11/26/20 1316)  chlordiazePOXIDE (LIBRIUM) capsule 100 mg (100 mg Oral Given 11/26/20 1301)    ED Course  I have reviewed the triage vital signs and the nursing notes.  Pertinent labs & imaging results that were available during my care of the patient were reviewed by me and considered in my medical decision making (see chart for details).    MDM Rules/Calculators/A&P                           35 yo M with a chief complaints of COVID-19 infection.  Has been having intractable nausea and vomiting.  Given nausea medicine.  Able to tolerate by mouth.  Discharged home.  3:38 PM:  I have discussed the diagnosis/risks/treatment options with the patient and believe the pt to be eligible for discharge home to follow-up with PCP. We  also discussed returning to the ED immediately if new or worsening sx occur. We discussed the sx which are most concerning (e.g., sudden worsening pain, fever, inability to tolerate by mouth) that necessitate immediate return. Medications administered to the patient during their visit and any new prescriptions provided to the patient are listed below.  Medications given during this visit Medications  sodium chloride 0.9 % bolus 1,000 mL (0 mLs Intravenous Stopped 11/26/20 1426)  ondansetron (ZOFRAN) injection 4 mg (4 mg Intravenous Given 11/26/20 1247)  alum & mag hydroxide-simeth (MAALOX/MYLANTA) 200-200-20 MG/5ML suspension 30 mL (30 mLs Oral Given 11/26/20 1248)  famotidine (PEPCID) IVPB 20 mg premix (0 mg Intravenous Stopped 11/26/20 1316)  chlordiazePOXIDE (LIBRIUM) capsule 100 mg (100 mg Oral Given 11/26/20 1301)     The patient appears reasonably screen and/or stabilized for discharge and I doubt any other medical condition or other [facility] requiring further screening, evaluation, or treatment in the ED at this time prior to discharge.   Final Clinical Impression(s) / ED Diagnoses Final diagnoses:  COVID-19 virus infection  Nausea and vomiting in adult    Rx / DC Orders ED Discharge Orders         Ordered    pantoprazole (PROTONIX) 20 MG tablet  Daily        11/26/20 1331    ondansetron (ZOFRAN ODT) 4 MG disintegrating tablet        11/26/20 1331    benzonatate (TESSALON) 100 MG capsule  Every 8 hours        11/26/20 1331           [place], DO 11/26/20 1538

## 2020-11-26 NOTE — ED Notes (Signed)
Patients pulse oxygen when lying flat was 97 when he got up to walk it went down to 95.

## 2020-11-26 NOTE — Discharge Instructions (Signed)
Take tylenol 2 pills 4 times a day and motrin 4 pills 3 times a day.  Drink plenty of fluids.  Return for worsening shortness of breath, headache, confusion. Follow up with your family doctor.   

## 2020-11-30 ENCOUNTER — Telehealth: Payer: Self-pay | Admitting: Nurse Practitioner

## 2020-11-30 NOTE — Telephone Encounter (Signed)
[facility] ([phone]) called pt for HFU. LVM for return call to schedule HFU appt. 

## 2020-12-03 ENCOUNTER — Ambulatory Visit: Payer: 59

## 2020-12-07 ENCOUNTER — Telehealth: Payer: Self-pay | Admitting: Psychiatry

## 2020-12-07 NOTE — Telephone Encounter (Signed)
Called in today asking to talk to someone about difficulties with his medications. Has appt 3/23 made need sooner.

## 2020-12-08 ENCOUNTER — Telehealth: Payer: Self-pay | Admitting: Psychiatry

## 2020-12-08 DIAGNOSIS — F419 Anxiety disorder, unspecified: Secondary | ICD-10-CM

## 2020-12-08 DIAGNOSIS — F331 Major depressive disorder, recurrent, moderate: Secondary | ICD-10-CM

## 2020-12-08 MED ORDER — ARIPIPRAZOLE 5 MG PO TABS: 5.0000 mg | ORAL_TABLET | Freq: Every day | ORAL | 1 refills | Status: DC

## 2020-12-08 MED ORDER — LORAZEPAM 0.5 MG PO TABS: 0.5000 mg | ORAL_TABLET | Freq: Every day | ORAL | 1 refills | Status: DC | PRN

## 2020-12-08 NOTE — Telephone Encounter (Signed)
Next appt is 01/26/21. States his Cymbalta is not working and he is having severe anxiety. This has been happening for 1-2 months. Pharmacy is [facility] on [address], Phone number is [phone].

## 2020-12-08 NOTE — Telephone Encounter (Signed)
Returned call to patient. He reports that he has been having increased depression, anxiety, and is sleeping frequently. He reports that he is having frequent panic attacks. Hydroxyzine and Propranolol have not been effective. Appetite has been poor. Denies SI.   He has had COVID and missed work.   He reports drinking on a few occasions due to severe- "I don't want to drink."  He reports that he has taken Ativan in the past with good response.   Plan: Add Abilify 5 mg po qd for augmentation of depression.  Add Ativan prn panic attacks.  Discussed potential metabolic side effects associated with atypical antipsychotics, as well as potential risk for movement side effects. Advised pt to contact office if movement side effects occur.  Discussed potential benefits, risk, and side effects of benzodiazepines to include potential risk of tolerance and dependence, as well as possible drowsiness.  Advised patient not to drive if experiencing drowsiness and to take lowest possible effective dose to minimize risk of dependence and tolerance.

## 2020-12-08 NOTE — Telephone Encounter (Signed)
Left patient voicemail to call back with concerns.

## 2020-12-08 NOTE — Addendum Note (Signed)
Addended by: Derenda Mis on: 12/08/2020 04:42 PM   Modules accepted: Orders

## 2020-12-30 ENCOUNTER — Other Ambulatory Visit: Payer: Self-pay | Admitting: Psychiatry

## 2020-12-30 DIAGNOSIS — F331 Major depressive disorder, recurrent, moderate: Secondary | ICD-10-CM

## 2021-01-03 ENCOUNTER — Other Ambulatory Visit: Payer: Self-pay | Admitting: Psychiatry

## 2021-01-03 DIAGNOSIS — F419 Anxiety disorder, unspecified: Secondary | ICD-10-CM

## 2021-01-26 ENCOUNTER — Telehealth (INDEPENDENT_AMBULATORY_CARE_PROVIDER_SITE_OTHER): Payer: 59 | Admitting: Psychiatry

## 2021-01-26 ENCOUNTER — Encounter: Payer: Self-pay | Admitting: Psychiatry

## 2021-01-26 DIAGNOSIS — F419 Anxiety disorder, unspecified: Secondary | ICD-10-CM | POA: Diagnosis not present

## 2021-01-26 DIAGNOSIS — F331 Major depressive disorder, recurrent, moderate: Secondary | ICD-10-CM

## 2021-01-26 MED ORDER — DULOXETINE HCL 60 MG PO CPEP: 60.0000 mg | ORAL_CAPSULE | Freq: Every day | ORAL | 2 refills | Status: DC

## 2021-01-26 NOTE — Progress Notes (Signed)
Edward Singh [id number] 01-30-1986 35 y.o.  Virtual Visit via Telephone Note  I connected with pt on  01/26/21 at 11:30 AM EDT by telephone and verified that I am speaking with the correct person using two identifiers.   I discussed the limitations, risks, security and privacy concerns of performing an evaluation and management service by telephone and the availability of in person appointments. I also discussed with the patient that there may be a patient responsible charge related to this service. The patient expressed understanding and agreed to proceed.   I discussed the assessment and treatment plan with the patient. The patient was provided an opportunity to ask questions and all were answered. The patient agreed with the plan and demonstrated an understanding of the instructions.   The patient was advised to call back or seek an in-person evaluation if the symptoms worsen or if the condition fails to improve as anticipated.  I provided 30 minutes of non-face-to-face time during this encounter.  The patient was located at home.  The provider was located at [facility].   Corie Chiquito, PMHNP   Subjective:   Patient ID:  Edward Singh is a 35 y.o. (DOB 1986-01-14) male.  Chief Complaint:  Chief Complaint  Patient presents with  . Follow-up    Anxiety, depression    HPI [name] presents for follow-up of anxiety and depression. He reports that he has been doing better over the last few months. He reports that he was getting headaches and muscle twitches with Abilify. He took Abilify for about 2 weeks and then stopped it due to side effects. He reports that his energy and motivation improved.   He reports that he has occasional situational sadness. Denies persistent depression. He reports that anxiety has been well controlled. Yetta Barre any recent full blown panic attacks. He reports periods of increased anxiety. Energy and motivation have been good.  Sleeping well. Goes to bed 10-11 pm and awakens around 6:30-7 am. Appetite has been ok. Concentration has been ok. Denies SI.   He reports that he is taking Ativan prn to fall asleep about 3-4 times a week.   He reports that he has not had any ETOH in about the last 3 months. Has been going to AA some. He is working with a sponsor. Has not seen a therapist recently.   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 Has had some marriage issues and reports that they have been working on this.   Past Psychiatric Medication Trials: Cymbalta Prozac- Initially effective and then no longer as effective. Felt tired Lexapro- Drowsiness Effexor- Seemed to have helped initially and then started drinking and taking less consistently. Abilify Propranolol-May have been effective for anxiety Librium- prescribed during detox.  Trazodone Vistaril- ineffective Ambien May have taken Adderall in school.  Review of Systems:  Review of Systems  Musculoskeletal: Negative for gait problem.  Neurological: Negative for tremors.  Psychiatric/Behavioral:       Please refer to HPI    Medications: I have reviewed the patient's current medications.  Current Outpatient Medications  Medication Sig Dispense Refill  . amLODipine (NORVASC) 10 MG tablet Take 10 mg by mouth daily.    . pantoprazole (PROTONIX) 20 MG tablet Take 1 tablet (20 mg total) by mouth daily. (Patient taking differently: Take 20 mg by mouth daily as needed.) 30 tablet 0  . DULoxetine (CYMBALTA) 60 MG capsule Take 1 capsule (60 mg total) by mouth daily. 90  capsule 2  . LORazepam (ATIVAN) 0.5 MG tablet TAKE 1 TABLET BY MOUTH EVERY DAY AS NEEDED FOR ANXIETY 30 tablet 1   No current facility-administered medications for this visit.    Medication Side Effects: None  Allergies: No Known Allergies  Past Medical History:  Diagnosis Date  . Alcoholism (HCC)   . Anxiety   . Broken arm   . Depression   . GERD (gastroesophageal  reflux disease)   . High cholesterol   . Hypertension   . Panic attacks     Family History  Problem Relation Age of Onset  . Healthy Mother   . Healthy Father     Social History   Socioeconomic History  . Marital status: Single    Spouse name: Not on file  . Number of children: Not on file  . Years of education: Not on file  . Highest education level: Not on file  Occupational History  . Not on file  Tobacco Use  . Smoking status: Former Games developer  . Smokeless tobacco: Former Neurosurgeon    Types: Engineer, drilling  . Vaping Use: Never used  Substance and Sexual Activity  . Alcohol use: Not Currently  . Drug use: No  . Sexual activity: Yes    Birth control/protection: None  Other Topics Concern  . Not on file  Social History Narrative  . Not on file   Social Determinants of Health   Financial Resource Strain: Not on file  Food Insecurity: Not on file  Transportation Needs: Not on file  Physical Activity: Not on file  Stress: Not on file  Social Connections: Not on file  Intimate Partner Violence: Not on file    Past Medical History, Surgical history, Social history, and Family history were reviewed and updated as appropriate.   Please see review of systems for further details on the patient's review from today.   Objective:   Physical Exam:  There were no vitals taken for this visit.  Physical Exam Neurological:     Mental Status: He is alert and oriented to person, place, and time.     Cranial Nerves: No dysarthria.  Psychiatric:        Attention and Perception: Attention and perception normal.        Mood and Affect: Mood normal.        Speech: Speech normal.        Behavior: Behavior is cooperative.        Thought Content: Thought content normal. Thought content is not paranoid or delusional. Thought content does not include homicidal or suicidal ideation. Thought content does not include homicidal or suicidal plan.        Cognition and Memory: Cognition  and memory normal.        Judgment: Judgment normal.     Comments: Insight intact     Lab Review:     Component Value Date/Time   NA 136 11/26/2020 1258   K 3.7 11/26/2020 1258   CL 101 11/26/2020 1258   CO2 26 03/16/2014 1225   GLUCOSE 106 (H) 11/26/2020 1258   BUN 11 11/26/2020 1258   CREATININE 0.80 11/26/2020 1258   CALCIUM 9.3 03/16/2014 1225   PROT 8.2 03/16/2014 1225   ALBUMIN 4.7 03/16/2014 1225   AST 63 (H) 03/16/2014 1225   ALT 60 (H) 03/16/2014 1225   ALKPHOS 99 03/16/2014 1225   BILITOT 1.5 (H) 03/16/2014 1225   GFRNONAA >90 03/16/2014 1225   GFRAA >90 03/16/2014 1225  Component Value Date/Time   WBC 4.4 03/16/2014 1225   RBC 5.57 03/16/2014 1225   HGB 15.0 11/26/2020 1258   HCT 44.0 11/26/2020 1258   PLT 239 03/16/2014 1225   MCV 87.3 03/16/2014 1225   MCH 31.1 03/16/2014 1225   MCHC 35.6 03/16/2014 1225   RDW 12.1 03/16/2014 1225   LYMPHSABS 1.7 03/16/2014 1225   MONOABS 0.3 03/16/2014 1225   EOSABS 0.0 03/16/2014 1225   BASOSABS 0.0 03/16/2014 1225    No results found for: POCLITH, LITHIUM   No results found for: PHENYTOIN, PHENOBARB, VALPROATE, CBMZ   .res Assessment: Plan:   Patient seen for 25 minutes.  Discussed continuing current plan of care since he reports mood and anxiety signs and symptoms have significantly improved since last visit. Will continue lorazepam 0.5 mg daily as needed for anxiety.  Discussed that it appears Lorazepam may have been filled at [facility] and then put back since patient reports that his grandmother feels lorazepam for him at [employer].  Discussed that he should still have at least one refill remaining at [facility].  Patient to call office to request a refill once he no longer has refills remaining. Will continue Cymbalta 60 mg daily for depression and anxiety. Agreed with this plan of remaining active in AA to continue support with maintaining sobriety.  Encourage patient to consider asking friends from AA about  possible therapist since he reports that he would prefer to see a therapist with history of alcohol dependence. Patient to follow-up in 3 months or sooner if clinically indicated. Patient advised to contact office with any questions, adverse effects, or acute worsening in signs and symptoms.   Shaw was seen today for follow-up.  Diagnoses and all orders for this visit:  Anxiety -     DULoxetine (CYMBALTA) 60 MG capsule; Take 1 capsule (60 mg total) by mouth daily.  Moderate episode of recurrent major depressive disorder (HCC) -     DULoxetine (CYMBALTA) 60 MG capsule; Take 1 capsule (60 mg total) by mouth daily.    Please see After Visit Summary for patient specific instructions.  Future Appointments  Date Time Provider Department Center  04/28/2021  8:30 AM Corie Chiquito, PMHNP [facility] None    No orders of the defined types were placed in this encounter.     -------------------------------

## 2021-02-04 ENCOUNTER — Other Ambulatory Visit: Payer: Self-pay | Admitting: Psychiatry

## 2021-02-04 DIAGNOSIS — F419 Anxiety disorder, unspecified: Secondary | ICD-10-CM

## 2021-02-04 NOTE — Telephone Encounter (Signed)
Controlled substance 

## 2021-02-04 NOTE — Telephone Encounter (Signed)
Sent!

## 2021-03-24 ENCOUNTER — Other Ambulatory Visit: Payer: Self-pay

## 2021-03-24 ENCOUNTER — Emergency Department (HOSPITAL_COMMUNITY)
Admission: EM | Admit: 2021-03-24 | Discharge: 2021-03-24 | Disposition: A | Discharge status: Home / Self Care | Payer: 59 | Attending: Emergency Medicine | Admitting: Emergency Medicine

## 2021-03-24 ENCOUNTER — Emergency Department (HOSPITAL_COMMUNITY): Payer: 59

## 2021-03-24 DIAGNOSIS — S0990XA Unspecified injury of head, initial encounter: Secondary | ICD-10-CM | POA: Diagnosis present

## 2021-03-24 DIAGNOSIS — Z79899 Other long term (current) drug therapy: Secondary | ICD-10-CM | POA: Insufficient documentation

## 2021-03-24 DIAGNOSIS — Y990 Civilian activity done for income or pay: Secondary | ICD-10-CM | POA: Diagnosis not present

## 2021-03-24 DIAGNOSIS — S0181XA Laceration without foreign body of other part of head, initial encounter: Secondary | ICD-10-CM | POA: Insufficient documentation

## 2021-03-24 DIAGNOSIS — Z87891 Personal history of nicotine dependence: Secondary | ICD-10-CM | POA: Diagnosis not present

## 2021-03-24 DIAGNOSIS — W270XXA Contact with workbench tool, initial encounter: Secondary | ICD-10-CM | POA: Diagnosis not present

## 2021-03-24 DIAGNOSIS — I10 Essential (primary) hypertension: Secondary | ICD-10-CM | POA: Insufficient documentation

## 2021-03-24 MED ORDER — TETANUS-DIPHTH-ACELL PERTUSSIS 5-2.5-18.5 LF-MCG/0.5 IM SUSY
0.5000 mL | PREFILLED_SYRINGE | Freq: Once | INTRAMUSCULAR | Status: DC
  Filled 2021-03-24: qty 0.5

## 2021-03-24 MED ORDER — LIDOCAINE-EPINEPHRINE-TETRACAINE (LET) TOPICAL GEL
3.0000 mL | Freq: Once | TOPICAL | Status: AC
  Administered 2021-03-24: 3 mL via TOPICAL
  Filled 2021-03-24: qty 3

## 2021-03-24 NOTE — ED Triage Notes (Signed)
Pt reports working on car with a pair of pliers and the pliers accidentally hit him in the forehead.  Small laceration noted to forehead, no bleeding. Pt denies loc.  Denies taking blood thinners.

## 2021-03-24 NOTE — Discharge Instructions (Signed)
Try to keep the area dry over the next 12 hours.  After that may use warm soapy water.  The Dermabond which is the glue will gently peel off.  Do not pick at this area.  Help with primary care provider as needed

## 2021-03-24 NOTE — ED Provider Notes (Signed)
[facility]ENC[facility]PT Provider Note   CSN: [id number] Arrival date & time: 03/24/21  2021     History laceration   Edward Singh is a 35 y.o. male with past medical history significant for EtOH use who presents for evaluation of head injury.  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  Machine coil backfired subsequently causing the screwdriver to hit his forehead.  He suffered approximate 1 cm laceration.  Mild bleeding.  He has a headache.  No anticoagulation.  Does have tenderness underlying laceration.  Tetanus <5 years ago per patient.  No vision changes, paresthesias, weakness, syncope, fever, chills, nausea or emesis.  Denies additional aggravating or alleviating factors.  History obtained from patient and past medical records.  No interpreter used  HPI     Past Medical History:  Diagnosis Date  . Alcoholism (HCC)   . Anxiety   . Broken arm   . Depression   . GERD (gastroesophageal reflux disease)   . High cholesterol   . Hypertension   . Panic attacks     Patient Active Problem List   Diagnosis Date Noted  . Alcohol dependence (HCC) 03/16/2014  . Anxiety 03/12/2014    Past Surgical History:  Procedure Laterality Date  . WISDOM TOOTH EXTRACTION         Family History  Problem Relation Age of Onset  . Healthy Mother   . Healthy Father     Social History   Tobacco Use  . Smoking status: Former Games developer  . Smokeless tobacco: Former Neurosurgeon    Types: Engineer, drilling  . Vaping Use: Never used  Substance Use Topics  . Alcohol use: Not Currently  . Drug use: No    Home Medications Prior to Admission medications   Medication Sig Start Date End Date Taking? Authorizing Provider  amLODipine (NORVASC) 10 MG tablet Take 10 mg by mouth daily. 05/14/20   [provider]  DULoxetine (CYMBALTA) 60 MG capsule Take 1 capsule (60 mg total) by mouth daily. 01/26/21   Corie Chiquito, PMHNP  LORazepam (ATIVAN) 0.5 MG tablet TAKE ONE  TABLET BY MOUTH DAILY AS NEEDED FOR ANXIETY 02/04/21   Corie Chiquito, PMHNP  pantoprazole (PROTONIX) 20 MG tablet Take 1 tablet (20 mg total) by mouth daily. Patient taking differently: Take 20 mg by mouth daily as needed. 11/26/20   Melene Plan, DO    Allergies    Patient has no known allergies.  Review of Systems   Review of Systems  Constitutional: Negative.   HENT: Negative.   Respiratory: Negative.   Cardiovascular: Negative.   Gastrointestinal: Negative.   Genitourinary: Negative.   Musculoskeletal: Negative.   Skin: Positive for wound.  Neurological: Positive for headaches. Negative for dizziness, tremors, syncope, facial asymmetry, weakness, light-headedness and numbness.  All other systems reviewed and are negative.   Physical Exam Updated Vital Signs BP (!) 158/92 (BP Location: Left Arm)   Pulse (!) 101   Temp 98 F (36.7 C) (Oral)   Resp 17   SpO2 100%   Physical Exam Vitals and nursing note reviewed.  Constitutional:      General: He is not in acute distress.    Appearance: He is well-developed. He is not ill-appearing, toxic-appearing or diaphoretic.  HENT:     Head: Normocephalic. Laceration present. No raccoon eyes, Battle's sign, abrasion, contusion, masses, right periorbital erythema or left periorbital erythema.     Jaw: There is normal jaw occlusion.      Comments:  Tenderness to midline forehead underlying laceration.  1 cm laceration to central forehead.  Mild oozing blood.    Mouth/Throat:     Lips: Pink.     Pharynx: Oropharynx is clear. Uvula midline.     Comments: Dentition intact.  Clear posterior pharynx Eyes:     Extraocular Movements: Extraocular movements intact.     Conjunctiva/sclera: Conjunctivae normal.     Pupils: Pupils are equal, round, and reactive to light.  Neck:     Trachea: Trachea and phonation normal.     Comments: Full range of motion without difficulty Cardiovascular:     Rate and Rhythm: Normal rate and regular rhythm.   Pulmonary:     Effort: Pulmonary effort is normal. No respiratory distress.  Abdominal:     General: There is no distension.     Palpations: Abdomen is soft.  Musculoskeletal:        General: Normal range of motion.     Cervical back: Full passive range of motion without pain, normal range of motion and neck supple.  Skin:    General: Skin is warm and dry.  Neurological:     Mental Status: He is alert.     ED Results / Procedures / Treatments   Labs (all labs ordered are listed, but only abnormal results are displayed) Labs Reviewed - No data to display  EKG None  Radiology CT Head Wo Contrast  Result Date: 03/24/2021 CLINICAL DATA:  Facial injury. EXAM: CT HEAD WITHOUT CONTRAST CT MAXILLOFACIAL WITHOUT CONTRAST TECHNIQUE: Multidetector CT imaging of the head and maxillofacial structures were performed using the standard protocol without intravenous contrast. Multiplanar CT image reconstructions of the maxillofacial structures were also generated. COMPARISON:  None. FINDINGS: CT HEAD FINDINGS Brain: No evidence of acute infarction, hemorrhage, hydrocephalus, extra-axial collection or mass lesion/mass effect. Vascular: No hyperdense vessel or unexpected calcification. Skull: Normal. Negative for fracture or focal lesion. Other: None. CT MAXILLOFACIAL FINDINGS Osseous: No fracture or mandibular dislocation. No destructive process. Orbits: Negative. No traumatic or inflammatory finding. Sinuses: Clear. Soft tissues: 12 mm rounded subcutaneous abnormality is seen in left maxillary region which may represent sebaceous cyst. No other abnormality is noted in the soft tissues. IMPRESSION: Normal head CT. No evidence of traumatic injury seen in the maxillofacial region. 12 mm rounded subcutaneous abnormality is seen in left maxillary region which may represent sebaceous cyst. Electronically Signed   By: Lupita Raider M.D.   On: 03/24/2021 20:50   CT Maxillofacial Wo Contrast  Result Date:  03/24/2021 CLINICAL DATA:  Facial injury. EXAM: CT HEAD WITHOUT CONTRAST CT MAXILLOFACIAL WITHOUT CONTRAST TECHNIQUE: Multidetector CT imaging of the head and maxillofacial structures were performed using the standard protocol without intravenous contrast. Multiplanar CT image reconstructions of the maxillofacial structures were also generated. COMPARISON:  None. FINDINGS: CT HEAD FINDINGS Brain: No evidence of acute infarction, hemorrhage, hydrocephalus, extra-axial collection or mass lesion/mass effect. Vascular: No hyperdense vessel or unexpected calcification. Skull: Normal. Negative for fracture or focal lesion. Other: None. CT MAXILLOFACIAL FINDINGS Osseous: No fracture or mandibular dislocation. No destructive process. Orbits: Negative. No traumatic or inflammatory finding. Sinuses: Clear. Soft tissues: 12 mm rounded subcutaneous abnormality is seen in left maxillary region which may represent sebaceous cyst. No other abnormality is noted in the soft tissues. IMPRESSION: Normal head CT. No evidence of traumatic injury seen in the maxillofacial region. 12 mm rounded subcutaneous abnormality is seen in left maxillary region which may represent sebaceous cyst. Electronically Signed   By: Fayrene Fearing  Christen Butter M.D.   On: 03/24/2021 20:50    Procedures .Marland KitchenLaceration Repair  Date/Time: 03/24/2021 9:06 PM Performed by: Ralph Leyden A, PA-C Authorized by: Linwood Dibbles, PA-C   Consent:    Consent obtained:  Verbal   Consent given by:  Patient   Risks discussed:  Infection, need for additional repair, pain, poor cosmetic result and poor wound healing   Alternatives discussed:  No treatment and delayed treatment Universal protocol:    Procedure explained and questions answered to patient or proxy's satisfaction: yes     Relevant documents present and verified: yes     Test results available: yes     Imaging studies available: yes     Required blood products, implants, devices, and special equipment  available: yes     Site/side marked: yes     Immediately prior to procedure, a time out was called: yes     Patient identity confirmed:  Verbally with patient Anesthesia:    Anesthesia method:  Local infiltration and topical application   Topical anesthetic:  LET Laceration details:    Location:  Face   Face location:  Forehead   Length (cm):  1   Depth (mm):  2 Pre-procedure details:    Preparation:  Patient was prepped and draped in usual sterile fashion and imaging obtained to evaluate for foreign bodies Exploration:    Hemostasis achieved with:  Direct pressure   Imaging obtained comment:  Ct head   Imaging outcome: foreign body not noted     Wound exploration: wound explored through full range of motion and entire depth of wound visualized     Wound extent: no foreign bodies/material noted, no muscle damage noted, no tendon damage noted, no underlying fracture noted and no vascular damage noted     Contaminated: no   Treatment:    Area cleansed with:  Povidone-iodine   Amount of cleaning:  Standard   Irrigation solution:  Sterile saline   Debridement:  None Skin repair:    Repair method:  Tissue adhesive Approximation:    Approximation:  Close Repair type:    Repair type:  Simple Post-procedure details:    Dressing:  Open (no dressing)   Procedure completion:  Tolerated well, no immediate complications     Medications Ordered in ED Medications  lidocaine-EPINEPHrine-tetracaine (LET) topical gel (3 mLs Topical Given 03/24/21 2042)    ED Course  I have reviewed the triage vital signs and the nursing notes.  Pertinent labs & imaging results that were available during my care of the patient were reviewed by me and considered in my medical decision making (see chart for details).   35 year old here for laceration to central forehead after screwdriver hit head after machine backfired at work.  He admits to headache.  He has some bony tenderness underlying laceration.  He  is not anticoagulated.  He is neurovascularly intact.  He is afebrile, nonseptic, non-ill-appearing.  There is some very minimal oozing of blood from the wound.  No pulsatile bleeding. Tetanus <5 years ago.  Given tenderness underlying laceration will obtain imaging.  Plan on attempting Dermabond if wound is not bleeding after pressure applied.  CT head WO acute abnormality  CT max face WO acute abnormality   Patient laceration closed with dermabond WO difficulty. See procedure note.  Will have him FU  The patient has been appropriately medically screened and/or stabilized in the ED. I have low suspicion for any other emergent medical condition which would require further  screening, evaluation or treatment in the ED or require inpatient management.  Patient is hemodynamically stable and in no acute distress.  Patient able to ambulate in department prior to ED.  Evaluation does not show acute pathology that would require ongoing or additional emergent interventions while in the emergency department or further inpatient treatment.  I have discussed the diagnosis with the patient and answered all questions.  Pain is been managed while in the emergency department and patient has no further complaints prior to discharge.  Patient is comfortable with plan discussed in room and is stable for discharge at this time.  I have discussed strict return precautions for returning to the emergency department.  Patient was encouraged to follow-up with PCP/specialist refer to at discharge.      MDM Rules/Calculators/A&P                           Final Clinical Impression(s) / ED Diagnoses Final diagnoses:  Laceration of forehead, initial encounter    Rx / DC Orders ED Discharge Orders    None       Cecilie Heidel A, PA-C 03/24/21 2107    Little, Ambrose Finlandachel Morgan, MD 03/24/21 2246

## 2021-04-14 ENCOUNTER — Other Ambulatory Visit: Payer: Self-pay | Admitting: Psychiatry

## 2021-04-14 DIAGNOSIS — F419 Anxiety disorder, unspecified: Secondary | ICD-10-CM

## 2021-04-14 NOTE — Telephone Encounter (Signed)
Please review

## 2021-04-14 NOTE — Telephone Encounter (Signed)
He said he leaves on Monday but is asking for it to be filled tomorrow because his grandmother is going to pick it up for him

## 2021-04-14 NOTE — Telephone Encounter (Signed)
Pt called in for refill on Lorazepam 0.5mg . States he is going out of town next week and would like if prescription could be refilled early. He would like a RTC [phone]. [facility] [address] [place], [place]

## 2021-04-15 NOTE — Telephone Encounter (Signed)
Pharmacy approved 

## 2021-04-28 ENCOUNTER — Telehealth (INDEPENDENT_AMBULATORY_CARE_PROVIDER_SITE_OTHER): Payer: 59 | Admitting: Psychiatry

## 2021-04-28 ENCOUNTER — Encounter: Payer: Self-pay | Admitting: Psychiatry

## 2021-04-28 DIAGNOSIS — F419 Anxiety disorder, unspecified: Secondary | ICD-10-CM

## 2021-04-28 MED ORDER — LORAZEPAM 0.5 MG PO TABS
ORAL_TABLET | ORAL | 3 refills | Status: DC
Start: 2021-05-13 — End: 2021-08-11

## 2021-04-28 MED ORDER — PANTOPRAZOLE SODIUM 20 MG PO TBEC: 20.0000 mg | DELAYED_RELEASE_TABLET | Freq: Every day | ORAL | 2 refills | Status: DC

## 2021-04-28 NOTE — Progress Notes (Signed)
Edward Singh [id number] May 11, 1986 34 y.o.  Virtual Visit via Telephone Note  I connected with pt on 04/28/21 at  8:30 AM EDT by telephone and verified that I am speaking with the correct person using two identifiers.   I discussed the limitations, risks, security and privacy concerns of performing an evaluation and management service by telephone and the availability of in person appointments. I also discussed with the patient that there may be a patient responsible charge related to this service. The patient expressed understanding and agreed to proceed.   I discussed the assessment and treatment plan with the patient. The patient was provided an opportunity to ask questions and all were answered. The patient agreed with the plan and demonstrated an understanding of the instructions.   The patient was advised to call back or seek an in-person evaluation if the symptoms worsen or if the condition fails to improve as anticipated.  I provided 15 minutes of non-face-to-face time during this encounter.  The patient was located at home.  The provider was located at [facility].   Corie Chiquito, PMHNP   Subjective:   Patient ID:  Edward Singh is a 35 y.o. (DOB 12-Feb-1986) male.  Chief Complaint:  Chief Complaint  Patient presents with   Follow-up    H/o Depression, anxiety, and ETOH misuse     HPI RMANI KELLOGG presents for follow-up of anxiety, depression, and h/o ETOH misuse. "Everything has been going great." Denies depressed mood. Denies anxiety. Denies any recent ETOH use.   Taking Ativan prn 3-4 times a week when he starts to feel "antsy, stressed, or can't sleep." He reports that he is sleeping well most of the time. Sleeping 10-7:30 am. Energy and motivation have been good. He reports that he has been productive. Concentration has been adequate. Denies SI.   Daughter is doing well and almost 69 yo. Marriage is going better.  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   Has been going to AA and NA. He has a close friend that is also in sobriety. Not seeing a therapist currently.   Past Psychiatric Medication Trials: Cymbalta Prozac- Initially effective and then no longer as effective. Felt tired Lexapro- Drowsiness Effexor- Seemed to have helped initially and then started drinking and taking less consistently.  Abilify Propranolol-May have been effective for anxiety Librium- prescribed during detox. Trazodone Vistaril- ineffective Ambien May have taken Adderall in school.  Review of Systems:  Review of Systems  Gastrointestinal: Negative.   Musculoskeletal:  Negative for gait problem.  Skin:        Laceration on forehead has been healing.   Neurological:  Negative for headaches.  Psychiatric/Behavioral:         Please refer to HPI   Medications: I have reviewed the patient's current medications.  Current Outpatient Medications  Medication Sig Dispense Refill   amLODipine (NORVASC) 10 MG tablet Take 10 mg by mouth daily.     DULoxetine (CYMBALTA) 60 MG capsule Take 1 capsule (60 mg total) by mouth daily. 90 capsule 2   [START ON 05/13/2021] LORazepam (ATIVAN) 0.5 MG tablet TAKE ONE TABLET BY MOUTH DAILY AS NEEDED FOR ANXIETY 30 tablet 3   pantoprazole (PROTONIX) 20 MG tablet Take 1 tablet (20 mg total) by mouth daily. 30 tablet 2   No current facility-administered medications for this visit.    Medication Side Effects: None  Allergies: No Known Allergies  Past Medical History:  Diagnosis Date   Alcoholism (HCC)    Anxiety  Broken arm    Depression    GERD (gastroesophageal reflux disease)    High cholesterol    Hypertension    Panic attacks     Family History  Problem Relation Age of Onset   Healthy Mother    Healthy Father     Social History   Socioeconomic History   Marital status: Single    Spouse name: Not on file   Number of children: Not on file   Years of education: Not on file   Highest education  level: Not on file  Occupational History   Not on file  Tobacco Use   Smoking status: Former    Pack years: 0.00   Smokeless tobacco: Former    Types: Associate Professor Use: Never used  Substance and Sexual Activity   Alcohol use: Not Currently   Drug use: No   Sexual activity: Yes    Birth control/protection: None  Other Topics Concern   Not on file  Social History Narrative   Not on file   Social Determinants of Health   Financial Resource Strain: Not on file  Food Insecurity: Not on file  Transportation Needs: Not on file  Physical Activity: Not on file  Stress: Not on file  Social Connections: Not on file  Intimate Partner Violence: Not on file    Past Medical History, Surgical history, Social history, and Family history were reviewed and updated as appropriate.   Please see review of systems for further details on the patient's review from today.   Objective:   Physical Exam:  There were no vitals taken for this visit.  Physical Exam Neurological:     Mental Status: He is alert and oriented to person, place, and time.     Cranial Nerves: No dysarthria.  Psychiatric:        Attention and Perception: Attention and perception normal.        Mood and Affect: Mood normal.        Speech: Speech normal.        Behavior: Behavior is cooperative.        Thought Content: Thought content normal. Thought content is not paranoid or delusional. Thought content does not include homicidal or suicidal ideation. Thought content does not include homicidal or suicidal plan.        Cognition and Memory: Cognition and memory normal.        Judgment: Judgment normal.     Comments: Insight intact    Lab Review:     Component Value Date/Time   NA 136 11/26/2020 1258   K 3.7 11/26/2020 1258   CL 101 11/26/2020 1258   CO2 26 03/16/2014 1225   GLUCOSE 106 (H) 11/26/2020 1258   BUN 11 11/26/2020 1258   CREATININE 0.80 11/26/2020 1258   CALCIUM 9.3 03/16/2014 1225    PROT 8.2 03/16/2014 1225   ALBUMIN 4.7 03/16/2014 1225   AST 63 (H) 03/16/2014 1225   ALT 60 (H) 03/16/2014 1225   ALKPHOS 99 03/16/2014 1225   BILITOT 1.5 (H) 03/16/2014 1225   GFRNONAA >90 03/16/2014 1225   GFRAA >90 03/16/2014 1225       Component Value Date/Time   WBC 4.4 03/16/2014 1225   RBC 5.57 03/16/2014 1225   HGB 15.0 11/26/2020 1258   HCT 44.0 11/26/2020 1258   PLT 239 03/16/2014 1225   MCV 87.3 03/16/2014 1225   MCH 31.1 03/16/2014 1225   MCHC 35.6 03/16/2014 1225  RDW 12.1 03/16/2014 1225   LYMPHSABS 1.7 03/16/2014 1225   MONOABS 0.3 03/16/2014 1225   EOSABS 0.0 03/16/2014 1225   BASOSABS 0.0 03/16/2014 1225    No results found for: POCLITH, LITHIUM   No results found for: PHENYTOIN, PHENOBARB, VALPROATE, CBMZ   .res Assessment: Plan:      Will continue current plan of care since target signs and symptoms are well controlled without any tolerability issues. Pt requests temporary refill of Protonix until scheduled annual physical with PCP in October. Will send in script for Protonix 20 mg qd with 2 refills.  Continue Cymbalta 60 mg po qd for depression and anxiety.  Continue Ativan 0.5 mg prn anxiety.  Pt to follow-up in 4 months or sooner if clinically indicated.  Patient advised to contact office with any questions, adverse effects, or acute worsening in signs and symptoms.  Lewayne was seen today for follow-up.  Diagnoses and all orders for this visit:  Anxiety -     LORazepam (ATIVAN) 0.5 MG tablet; TAKE ONE TABLET BY MOUTH DAILY AS NEEDED FOR ANXIETY  Other orders -     pantoprazole (PROTONIX) 20 MG tablet; Take 1 tablet (20 mg total) by mouth daily.   Please see After Visit Summary for patient specific instructions.  No future appointments.  No orders of the defined types were placed in this encounter.     -------------------------------

## 2021-06-14 ENCOUNTER — Telehealth: Payer: Self-pay | Admitting: Psychiatry

## 2021-06-14 NOTE — Telephone Encounter (Signed)
Addendum to previous message on 06/14/21. [facility] on [address] just called to get permission to do an early refill on Lorazepam. Their phone number is [phone].

## 2021-06-14 NOTE — Telephone Encounter (Signed)
Pt called and said that he is leaving tomorrow evening or Thursday am to go out of town for a week. He needs [place] to call the pharmacy to ok and early refill on his ativan.

## 2021-06-14 NOTE — Telephone Encounter (Signed)
The pharmacy has called and asked as well but it just seems way too early especially since he should be back in town before 8/22

## 2021-06-14 NOTE — Telephone Encounter (Signed)
LVM informing pt he can pick up tomorrow

## 2021-06-14 NOTE — Telephone Encounter (Signed)
I apologize,I did not count the weeks right !I will call them now

## 2021-06-14 NOTE — Telephone Encounter (Signed)
Filled 7/15 so refill is due 8/22.He stated he will be out of town for a week but 8/22 is almost 3 weeks away.Can we send refill to pharmacy out of town when it's due?

## 2021-06-14 NOTE — Telephone Encounter (Signed)
It is due again on 06/17/21, not 06/27/21.

## 2021-06-14 NOTE — Telephone Encounter (Signed)
Script last filled 05/20/21 and would be due for refill 4 weeks later, which would be 06/17/21. Please call pharmacy and authorize early fill of 06/15/21.

## 2021-06-15 NOTE — Telephone Encounter (Signed)
I spoke to them yesterday and approved this.I don't know why they need further clarification.Based off the info given is early refill still ok?

## 2021-06-15 NOTE — Telephone Encounter (Signed)
Yes, ok to approve. Will monitor for any subsequent early refill requests.

## 2021-06-15 NOTE — Telephone Encounter (Signed)
Per pharmacist  at [facility] Was filled 5/19  for 30 days  - approved for early fill 6/10, 6/10 filled for #30. Filled on 7/15 for 30 days too. He should not need fill til 8/18 so it is eight days early.  Are you approving it early for today still? Will need to call pharmacy to let them know.

## 2021-06-15 NOTE — Telephone Encounter (Signed)
Spoke to pharmacy and approved again

## 2021-08-09 ENCOUNTER — Telehealth: Payer: Self-pay | Admitting: Psychiatry

## 2021-08-09 NOTE — Telephone Encounter (Signed)
Pt called to have refill of Lorazepam. I advised that he should have refills. He is aware but said he gets his grandma to get it for him when she goes to get her meds and would like to know if you or Almira Coaster can allow it to be filled a little earlier.

## 2021-08-09 NOTE — Telephone Encounter (Signed)
Will need to wait until 08/11/21. Pharmacy commented in the past that there have been multiple requests for early fills.

## 2021-08-09 NOTE — Telephone Encounter (Signed)
Please review

## 2021-08-09 NOTE — Telephone Encounter (Signed)
07/14/21

## 2021-08-09 NOTE — Telephone Encounter (Signed)
Pt informed

## 2021-08-11 ENCOUNTER — Other Ambulatory Visit: Payer: Self-pay

## 2021-08-11 ENCOUNTER — Telehealth: Payer: Self-pay | Admitting: Psychiatry

## 2021-08-11 DIAGNOSIS — F419 Anxiety disorder, unspecified: Secondary | ICD-10-CM

## 2021-08-11 MED ORDER — LORAZEPAM 0.5 MG PO TABS
ORAL_TABLET | ORAL | 2 refills | Status: DC
Start: 2021-08-11 — End: 2021-10-04

## 2021-08-11 NOTE — Telephone Encounter (Signed)
Pended.

## 2021-08-11 NOTE — Telephone Encounter (Signed)
Pt was told he could get his refill today. But if so, since it didn't get sent to [facility], he'd like it sent to [facility] on [address] which is closer to his home.

## 2021-08-26 ENCOUNTER — Ambulatory Visit: Payer: 59 | Admitting: Psychiatry

## 2021-09-05 ENCOUNTER — Telehealth: Payer: Self-pay | Admitting: Psychiatry

## 2021-09-05 NOTE — Telephone Encounter (Signed)
Will you allow it?He seems to get early refills often.Last filled 10/7

## 2021-09-05 NOTE — Telephone Encounter (Signed)
Pt called requesting refill for Lorazepam to [facility] [place].  He wants it filled by Friday.  He will be going out of town for 2 wks.   Next appt 11/29

## 2021-09-06 ENCOUNTER — Other Ambulatory Visit: Payer: Self-pay

## 2021-09-06 DIAGNOSIS — F419 Anxiety disorder, unspecified: Secondary | ICD-10-CM

## 2021-09-06 NOTE — Telephone Encounter (Signed)
Will call pharmacy to fill on friday

## 2021-09-27 ENCOUNTER — Other Ambulatory Visit: Payer: Self-pay | Admitting: Psychiatry

## 2021-10-04 ENCOUNTER — Ambulatory Visit (INDEPENDENT_AMBULATORY_CARE_PROVIDER_SITE_OTHER): Payer: Self-pay | Admitting: Psychiatry

## 2021-10-04 ENCOUNTER — Encounter: Payer: Self-pay | Admitting: Psychiatry

## 2021-10-04 DIAGNOSIS — F419 Anxiety disorder, unspecified: Secondary | ICD-10-CM

## 2021-10-04 DIAGNOSIS — F331 Major depressive disorder, recurrent, moderate: Secondary | ICD-10-CM

## 2021-10-04 MED ORDER — LORAZEPAM 0.5 MG PO TABS
ORAL_TABLET | ORAL | 2 refills | Status: DC
Start: 2021-12-02 — End: 2021-12-05

## 2021-10-04 MED ORDER — DULOXETINE HCL 60 MG PO CPEP: 60.0000 mg | ORAL_CAPSULE | Freq: Every day | ORAL | 2 refills | Status: DC

## 2021-10-04 NOTE — Progress Notes (Signed)
Edward Singh [id number] December 22, 1985 35 y.o.  Virtual Visit via Telephone Note  I connected with pt on 10/04/21 at  9:00 AM EST by telephone and verified that I am speaking with the correct person using two identifiers.   I discussed the limitations, risks, security and privacy concerns of performing an evaluation and management service by telephone and the availability of in person appointments. I also discussed with the patient that there may be a patient responsible charge related to this service. The patient expressed understanding and agreed to proceed.   I discussed the assessment and treatment plan with the patient. The patient was provided an opportunity to ask questions and all were answered. The patient agreed with the plan and demonstrated an understanding of the instructions.   The patient was advised to call back or seek an in-person evaluation if the symptoms worsen or if the condition fails to improve as anticipated.  I provided 12 minutes of non-face-to-face time during this encounter.  The patient was located at home.  The provider was located at [facility].   Corie Chiquito, PMHNP   Subjective:   Patient ID:  Edward Singh is a 35 y.o. (DOB 06-15-1986) male.  Chief Complaint:  Chief Complaint  Patient presents with   Follow-up    Anxiety, depression    HPI Edward Singh presents for follow-up of anxiety and depression. He reports that he has been doing well. Denies anxiety. Reports panic on rare occasions, such as during travel. Denies depressed mood. Sleeping well and estimates sleeping 7-8 hours a night. Appetite has been good. Energy and motivation have been good. Concentration has been good. Denies SI.   Denies any stressors. Attending AA. Reports that he has not had any relapses and is maintaining sobriety. He reports that work has been busy.   Family recently had the flu.   Reports taking Lorazepam many evenings after work and if  he has a panic attack.   He has been going out of town for work at times.   Past Psychiatric Medication Trials: Cymbalta Prozac- Initially effective and then no longer as effective. Felt tired Lexapro- Drowsiness Effexor- Seemed to have helped initially and then started drinking and taking less consistently.  Abilify Propranolol-May have been effective for anxiety Librium- prescribed during detox. Trazodone Vistaril- ineffective Ambien May have taken Adderall in school.  Review of Systems:  Review of Systems  Gastrointestinal: Negative.   Musculoskeletal:  Negative for gait problem.  Neurological:  Negative for headaches.  Psychiatric/Behavioral:         Please refer to HPI   Medications: I have reviewed the patient's current medications.  Current Outpatient Medications  Medication Sig Dispense Refill   amLODipine (NORVASC) 10 MG tablet Take 10 mg by mouth daily.     pantoprazole (PROTONIX) 20 MG tablet TAKE ONE TABLET BY MOUTH DAILY (Patient taking differently: 20 mg daily as needed.) 30 tablet 0   DULoxetine (CYMBALTA) 60 MG capsule Take 1 capsule (60 mg total) by mouth daily. 90 capsule 2   [START ON 12/02/2021] LORazepam (ATIVAN) 0.5 MG tablet TAKE ONE TABLET BY MOUTH DAILY AS NEEDED FOR ANXIETY 30 tablet 2   No current facility-administered medications for this visit.    Medication Side Effects: None  Allergies: No Known Allergies  Past Medical History:  Diagnosis Date   Alcoholism (HCC)    Anxiety    Broken arm    Depression    GERD (gastroesophageal reflux disease)    High  cholesterol    Hypertension    Panic attacks     Family History  Problem Relation Age of Onset   Healthy Mother    Healthy Father     Social History   Socioeconomic History   Marital status: Single    Spouse name: Not on file   Number of children: Not on file   Years of education: Not on file   Highest education level: Not on file  Occupational History   Not on file   Tobacco Use   Smoking status: Former   Smokeless tobacco: Former    Types: Associate Professor Use: Never used  Substance and Sexual Activity   Alcohol use: Not Currently   Drug use: No   Sexual activity: Yes    Birth control/protection: None  Other Topics Concern   Not on file  Social History Narrative   Not on file   Social Determinants of Health   Financial Resource Strain: Not on file  Food Insecurity: Not on file  Transportation Needs: Not on file  Physical Activity: Not on file  Stress: Not on file  Social Connections: Not on file  Intimate Partner Violence: Not on file    Past Medical History, Surgical history, Social history, and Family history were reviewed and updated as appropriate.   Please see review of systems for further details on the patient's review from today.   Objective:   Physical Exam:  There were no vitals taken for this visit.  Physical Exam Neurological:     Mental Status: He is alert and oriented to person, place, and time.     Cranial Nerves: No dysarthria.  Psychiatric:        Attention and Perception: Attention and perception normal.        Mood and Affect: Mood normal.        Speech: Speech normal.        Behavior: Behavior is cooperative.        Thought Content: Thought content normal. Thought content is not paranoid or delusional. Thought content does not include homicidal or suicidal ideation. Thought content does not include homicidal or suicidal plan.        Cognition and Memory: Cognition and memory normal.        Judgment: Judgment normal.     Comments: Insight intact    Lab Review:     Component Value Date/Time   NA 136 11/26/2020 1258   K 3.7 11/26/2020 1258   CL 101 11/26/2020 1258   CO2 26 03/16/2014 1225   GLUCOSE 106 (H) 11/26/2020 1258   BUN 11 11/26/2020 1258   CREATININE 0.80 11/26/2020 1258   CALCIUM 9.3 03/16/2014 1225   PROT 8.2 03/16/2014 1225   ALBUMIN 4.7 03/16/2014 1225   AST 63 (H)  03/16/2014 1225   ALT 60 (H) 03/16/2014 1225   ALKPHOS 99 03/16/2014 1225   BILITOT 1.5 (H) 03/16/2014 1225   GFRNONAA >90 03/16/2014 1225   GFRAA >90 03/16/2014 1225       Component Value Date/Time   WBC 4.4 03/16/2014 1225   RBC 5.57 03/16/2014 1225   HGB 15.0 11/26/2020 1258   HCT 44.0 11/26/2020 1258   PLT 239 03/16/2014 1225   MCV 87.3 03/16/2014 1225   MCH 31.1 03/16/2014 1225   MCHC 35.6 03/16/2014 1225   RDW 12.1 03/16/2014 1225   LYMPHSABS 1.7 03/16/2014 1225   MONOABS 0.3 03/16/2014 1225   EOSABS 0.0 03/16/2014 1225  BASOSABS 0.0 03/16/2014 1225    No results found for: POCLITH, LITHIUM   No results found for: PHENYTOIN, PHENOBARB, VALPROATE, CBMZ   .res Assessment: Plan:   Will continue current plan of care since target signs and symptoms are well controlled without any tolerability issues. Continue Cymbalta 60 mg po qd for anxiety and depression.  Continue Ativan 0.5 mg po qd prn anxiety.  Pt to follow-up in 6 months or sooner if clinically indicated.  Patient advised to contact office with any questions, adverse effects, or acute worsening in signs and symptoms.  Markevion was seen today for follow-up.  Diagnoses and all orders for this visit:  Anxiety -     LORazepam (ATIVAN) 0.5 MG tablet; TAKE ONE TABLET BY MOUTH DAILY AS NEEDED FOR ANXIETY -     DULoxetine (CYMBALTA) 60 MG capsule; Take 1 capsule (60 mg total) by mouth daily.  Moderate episode of recurrent major depressive disorder (HCC) -     DULoxetine (CYMBALTA) 60 MG capsule; Take 1 capsule (60 mg total) by mouth daily.   Please see After Visit Summary for patient specific instructions.  No future appointments.  No orders of the defined types were placed in this encounter.     -------------------------------

## 2021-11-01 ENCOUNTER — Other Ambulatory Visit: Payer: Self-pay | Admitting: Psychiatry

## 2021-11-21 ENCOUNTER — Other Ambulatory Visit: Payer: Self-pay | Admitting: Psychiatry

## 2021-11-21 DIAGNOSIS — F419 Anxiety disorder, unspecified: Secondary | ICD-10-CM

## 2021-11-22 ENCOUNTER — Other Ambulatory Visit: Payer: Self-pay | Admitting: Psychiatry

## 2021-11-22 ENCOUNTER — Telehealth: Payer: Self-pay | Admitting: Psychiatry

## 2021-11-22 DIAGNOSIS — F419 Anxiety disorder, unspecified: Secondary | ICD-10-CM

## 2021-11-22 NOTE — Telephone Encounter (Signed)
Next visit is 05/23/22. Requesting refill on Lorazepam called to:  [facility] - [place], [place] - [address]  Phone:  [phone]  Fax:  [phone]

## 2021-11-22 NOTE — Telephone Encounter (Signed)
Filled 12/29 due 1/26

## 2021-11-23 NOTE — Telephone Encounter (Signed)
Pt informed

## 2021-11-23 NOTE — Telephone Encounter (Signed)
Addendum to 11/22/21 message. Pt called and said that he has 3 pills left of the Lorazepam and will be out before 1/26. Can he please get a few to last until 1/26 when he gets it filled? His phone number is [phone].

## 2021-11-23 NOTE — Telephone Encounter (Signed)
Ok to authorize fill on 12/01/21 since that is 4 weeks since his last fill. Recommend he take 1/2 tab until he can refill again. Recommend he talk with pharmacy if he thinks he did not receive full amount and they can verify this for him.

## 2021-11-23 NOTE — Telephone Encounter (Signed)
I asked pt has he been taking as prescribed and he said yes and does not know if he dropped some or if the pharmacy shorted him.He is asking for 5 pills until his refill is due

## 2021-11-30 ENCOUNTER — Other Ambulatory Visit: Payer: Self-pay | Admitting: Psychiatry

## 2021-11-30 DIAGNOSIS — F419 Anxiety disorder, unspecified: Secondary | ICD-10-CM

## 2021-11-30 NOTE — Telephone Encounter (Signed)
Has an Rx pended for 1/27.

## 2021-12-02 ENCOUNTER — Other Ambulatory Visit: Payer: Self-pay | Admitting: Psychiatry

## 2021-12-02 DIAGNOSIS — F419 Anxiety disorder, unspecified: Secondary | ICD-10-CM

## 2021-12-02 NOTE — Telephone Encounter (Signed)
Rx was sent previously, pended for today.

## 2021-12-05 ENCOUNTER — Other Ambulatory Visit: Payer: Self-pay | Admitting: Psychiatry

## 2021-12-05 DIAGNOSIS — F419 Anxiety disorder, unspecified: Secondary | ICD-10-CM

## 2021-12-05 MED ORDER — LORAZEPAM 0.5 MG PO TABS: ORAL_TABLET | ORAL | 2 refills | Status: DC

## 2021-12-05 NOTE — Telephone Encounter (Signed)
Pt call advising his pharmacy for all meds from [facility] should be going to [facility] on [address]. Lorazepam Rx was sent to [facility]. [facility] & send to [facility]. Apt  7/18

## 2022-02-20 ENCOUNTER — Other Ambulatory Visit: Payer: Self-pay | Admitting: Psychiatry

## 2022-02-20 DIAGNOSIS — F419 Anxiety disorder, unspecified: Secondary | ICD-10-CM

## 2022-02-21 ENCOUNTER — Telehealth: Payer: Self-pay | Admitting: Psychiatry

## 2022-02-21 DIAGNOSIS — F419 Anxiety disorder, unspecified: Secondary | ICD-10-CM

## 2022-02-21 MED ORDER — LORAZEPAM 0.5 MG PO TABS: ORAL_TABLET | ORAL | 2 refills | Status: DC

## 2022-02-21 NOTE — Telephone Encounter (Signed)
Please contact pharmacy and request that they fill Klonopin on 02/23/22 due to pt going out of town for one week.  ?

## 2022-02-21 NOTE — Telephone Encounter (Signed)
Edward Singh called about the refill for his lorazepam.  He needs to pick it up early because he is going on vacation on Thursday and needs to be able to pick it up tomorrow to be able to take it with him.  He said fill was 3/24 so it will be an early refill.  Please authorize him to be able to get it early as a vacation fill.  Apt 7/18.  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 ?

## 2022-02-21 NOTE — Telephone Encounter (Signed)
Next visit is 05/23/22. Edward Singh called requesting a refill on his Lorazepam 0.5 mg called to: ? ?[facility] - [place], [place] - [address] ? ?Phone:  [phone]  ?Fax:  [phone]  ? ? ?Edward Singh is going out of town this Thursday 4/20 for one week and would like to pick it up before he leaves.  ?

## 2022-02-22 NOTE — Telephone Encounter (Signed)
Called pharmacy to authorize early refill.

## 2022-03-21 ENCOUNTER — Telehealth: Payer: Self-pay | Admitting: Psychiatry

## 2022-03-21 DIAGNOSIS — G4700 Insomnia, unspecified: Secondary | ICD-10-CM

## 2022-03-21 MED ORDER — TRAZODONE HCL 100 MG PO TABS: ORAL_TABLET | ORAL | 2 refills | Status: DC

## 2022-03-21 NOTE — Telephone Encounter (Signed)
Pt leaving for 5 days @ 4:00 AM. Asking to get Lorazepam RF today. Bottle reads fill date 4/18. Pharmacy said will be 4/20 before he can RF.  ?Can't sleep with out it. Advise if he can fill today. Pt contact # [phone]. Apt 7/18 ?  ?

## 2022-03-21 NOTE — Addendum Note (Signed)
Addended by: Derenda Mis on: 03/21/2022 03:56 PM ? ? Modules accepted: Orders ? ?

## 2022-03-21 NOTE — Telephone Encounter (Signed)
PDMP shows Lorazepam was last filled 02/22/22 and that was an early fill from 01/28/22 the month before. Also filled 01/01/22. Pharmacy may be basing fill date off of the early refill. He has had multiple requests for early fills. Would you please contact him to see if he would like Trazodone sent in to help with sleep instead of the Lorazepam and then he can use Lorazepam mainly prn anxiety? ?

## 2022-03-21 NOTE — Telephone Encounter (Signed)
03/23/22 is the earliest RF per our records and pharmacy agrees. Patient notified. He asked for RF tomorrow and I again told him 5/18. He is agreeable to trazodone. [facility] on [address] is the pharmacy.  ?

## 2022-03-21 NOTE — Telephone Encounter (Signed)
Trazodone script was sent to his pharmacy.  ?

## 2022-03-29 IMAGING — CT CT MAXILLOFACIAL W/O CM
3 series · 15 of 47 positions shown, 18 images · non-contrast
Comparison: None.

CLINICAL DATA: Facial injury.

EXAM:
CT HEAD WITHOUT CONTRAST
CT MAXILLOFACIAL WITHOUT CONTRAST
TECHNIQUE: Multidetector CT imaging of the head and maxillofacial structures
were performed using the standard protocol without intravenous
contrast. Multiplanar CT image reconstructions of the maxillofacial
structures were also generated.

[Series 3: max soft · axial · 0.41mm/px · z∈[-262,-88]mm · 9 of 101 slices shown, 12 images]
[im 7/101  brain]
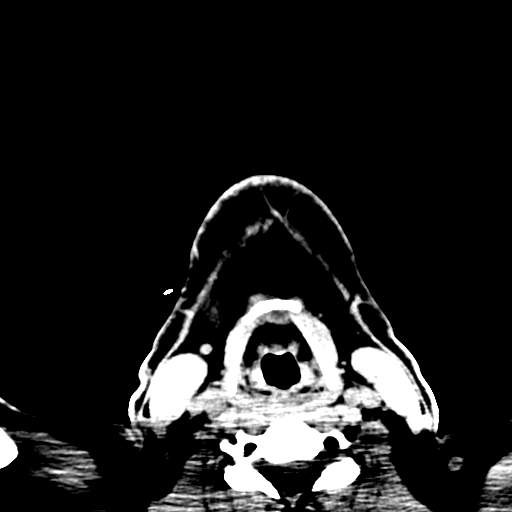
[im 7/101  bone]
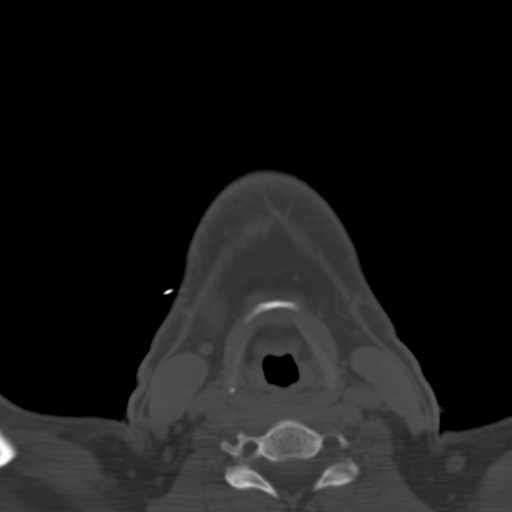
[im 18/101  bone]
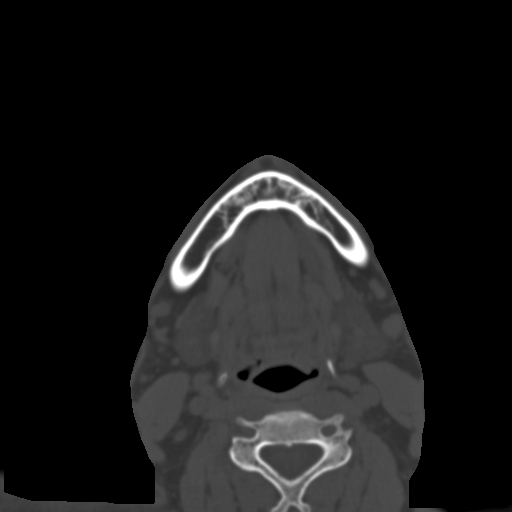
[im 28/101  bone]
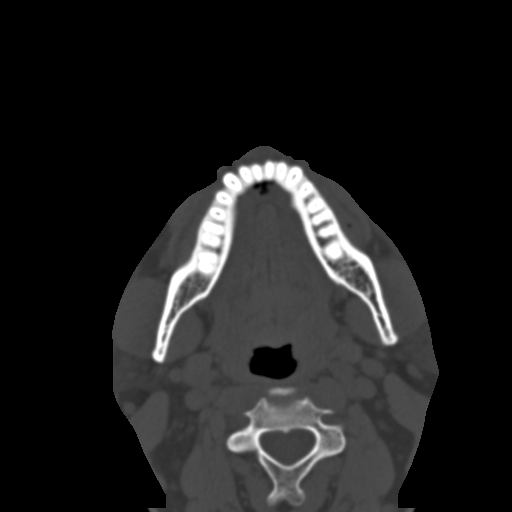
[im 38/101  bone]
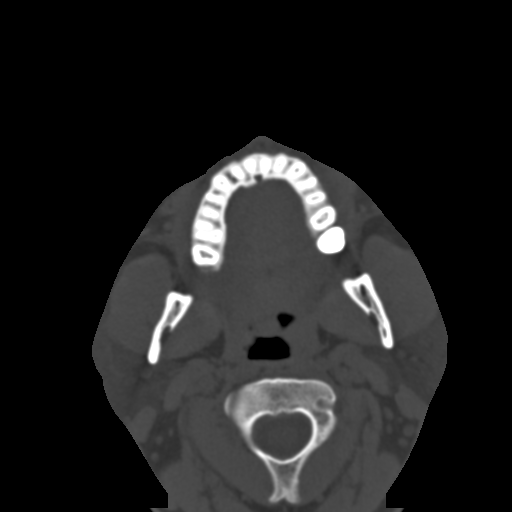
[im 52/101  brain]
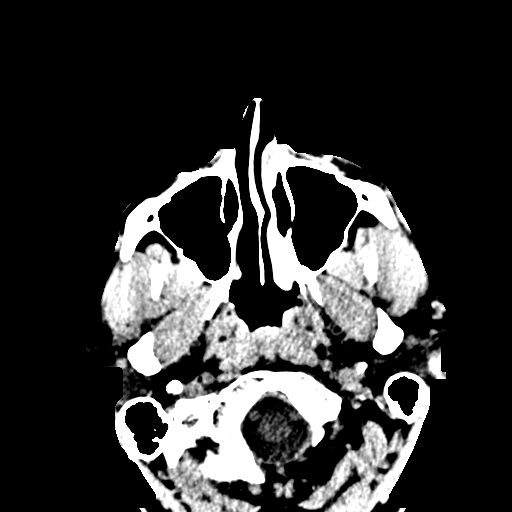
[im 52/101  bone]
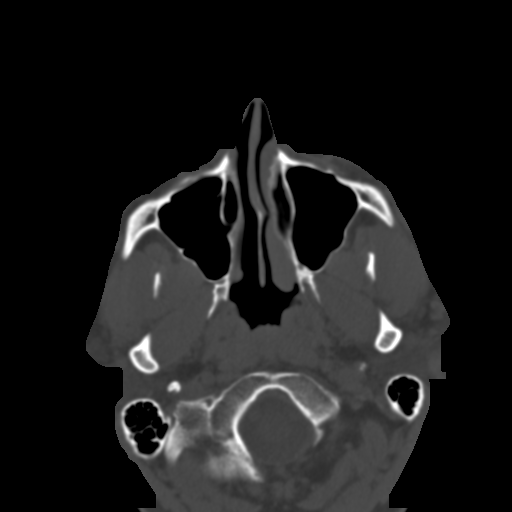
[im 63/101  bone]
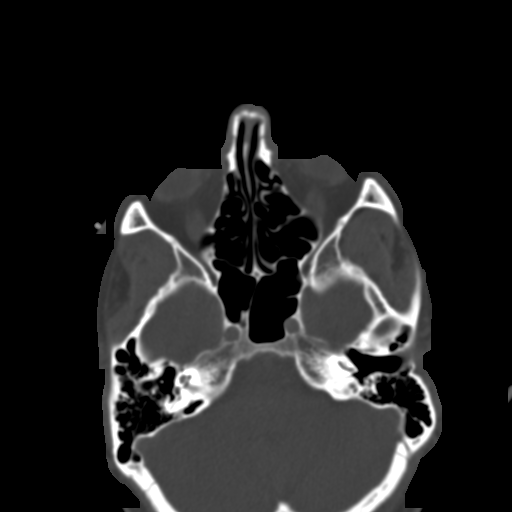
[im 73/101  bone]
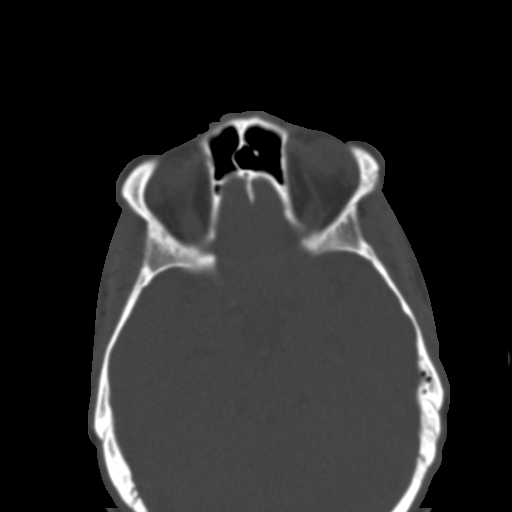
[im 83/101  bone]
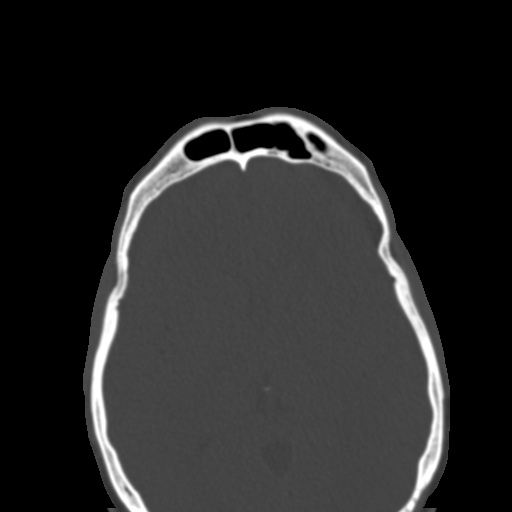
[im 94/101  brain]
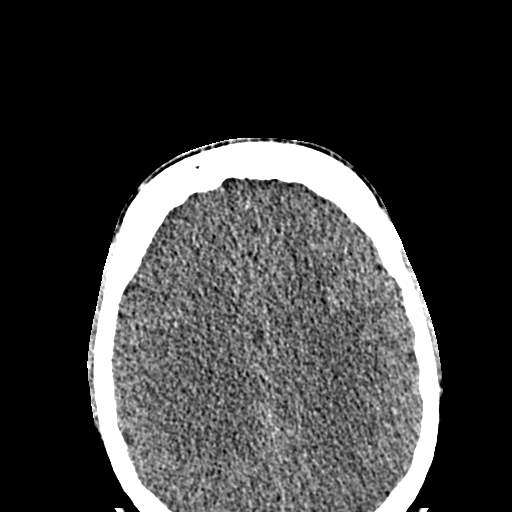
[im 94/101  bone]
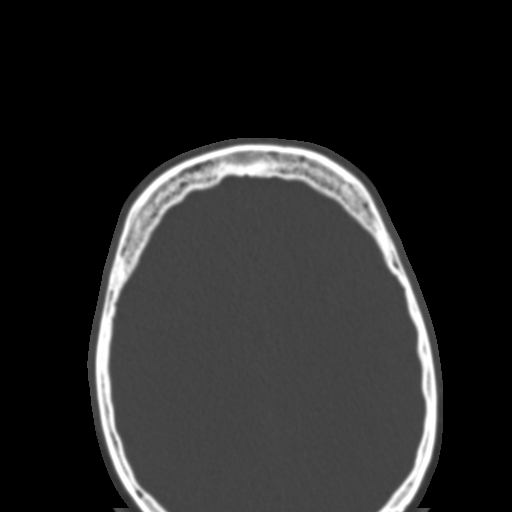

[Series 7: coronal soft · coronal · 0.40mm/px · 3 of 96 slices shown]
[im 32/96  bone]
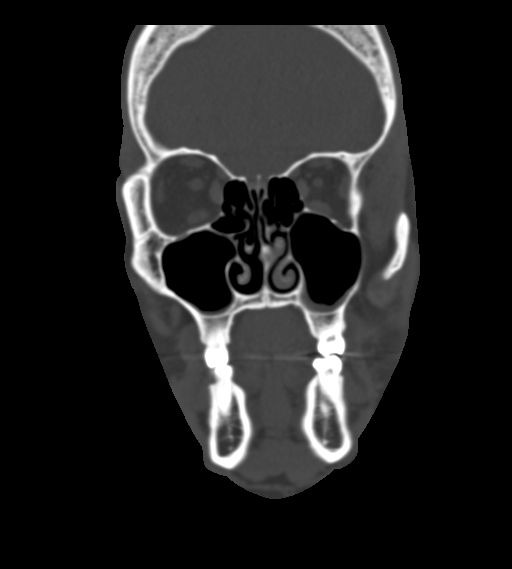
[im 43/96  bone]
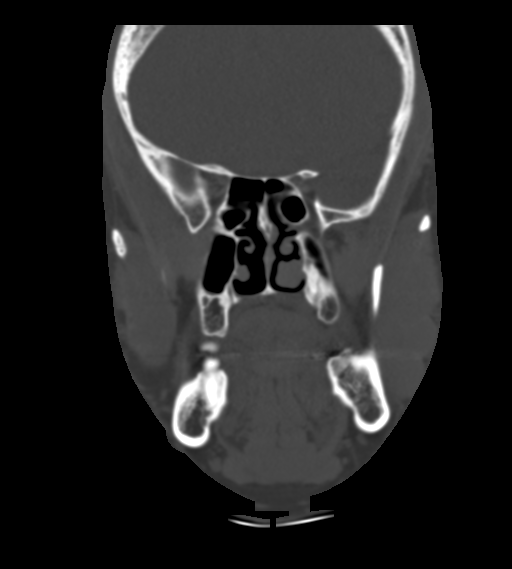
[im 53/96  bone]
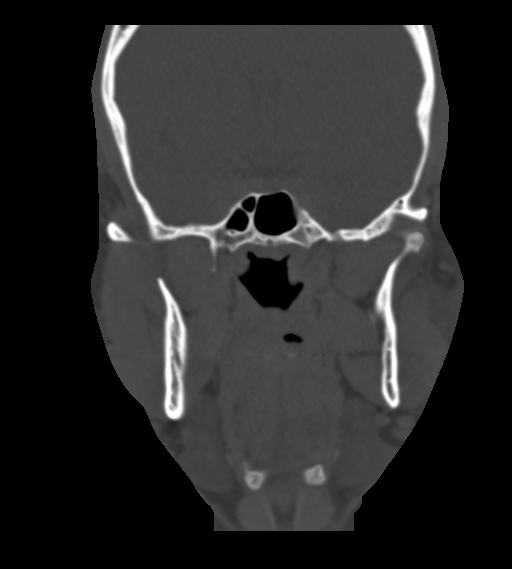

[Series 8: sagittal soft · sagittal · 0.41mm/px · 3 of 98 slices shown]
[im 33/98  bone]
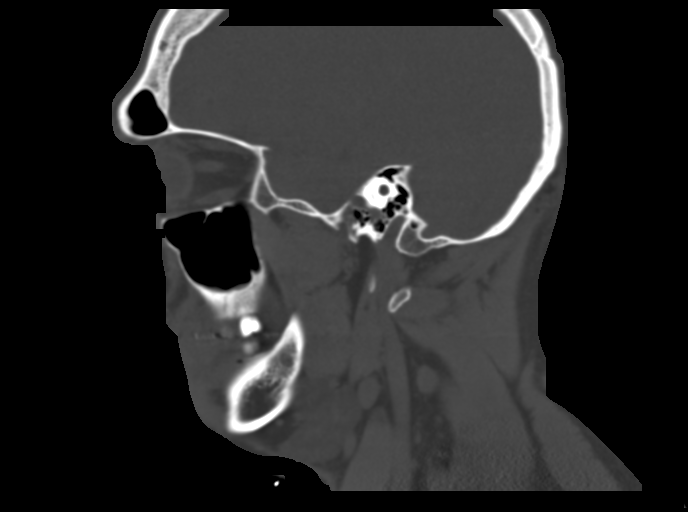
[im 49/98  bone]
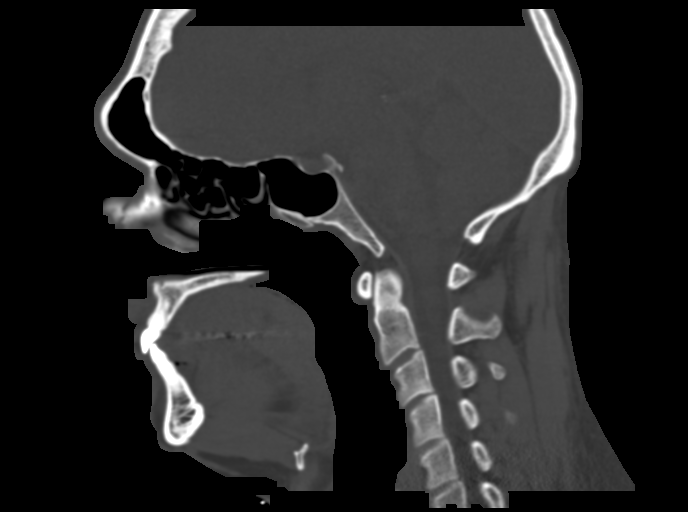
[im 65/98  bone]
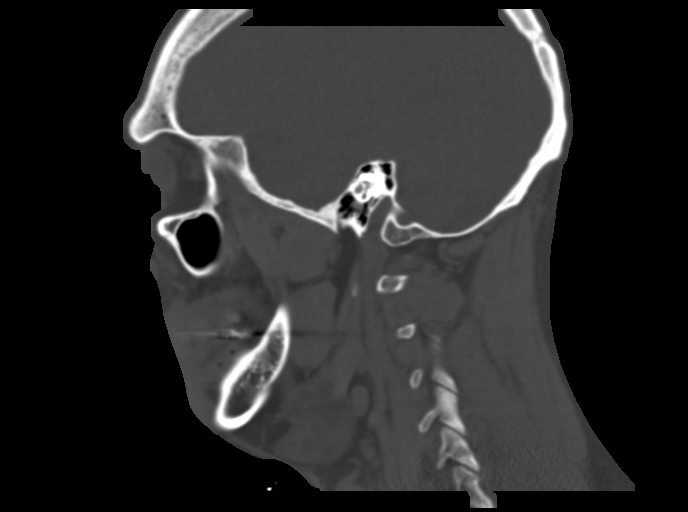

[15 of 47 positions shown; findings below may reference images not displayed]

FINDINGS: CT HEAD FINDINGS

Brain: No evidence of acute infarction, hemorrhage, hydrocephalus,
extra-axial collection or mass lesion/mass effect.

Vascular: No hyperdense vessel or unexpected calcification.

Skull: Normal. Negative for fracture or focal lesion.

Other: None.

CT MAXILLOFACIAL FINDINGS

Osseous: No fracture or mandibular dislocation. No destructive
process.

Orbits: Negative. No traumatic or inflammatory finding.

Sinuses: Clear.

Soft tissues: 12 mm rounded subcutaneous abnormality is seen in left
maxillary region which may represent sebaceous cyst. No other
abnormality is noted in the soft tissues.
IMPRESSION: Normal head CT.

No evidence of traumatic injury seen in the maxillofacial region. 12
mm rounded subcutaneous abnormality is seen in left maxillary region
which may represent sebaceous cyst.

## 2022-05-04 DIAGNOSIS — F1011 Alcohol abuse, in remission: Secondary | ICD-10-CM | POA: Diagnosis not present

## 2022-05-04 DIAGNOSIS — F411 Generalized anxiety disorder: Secondary | ICD-10-CM | POA: Diagnosis not present

## 2022-05-04 DIAGNOSIS — F410 Panic disorder [episodic paroxysmal anxiety] without agoraphobia: Secondary | ICD-10-CM | POA: Diagnosis not present

## 2022-05-04 DIAGNOSIS — I10 Essential (primary) hypertension: Secondary | ICD-10-CM | POA: Diagnosis not present

## 2022-05-11 ENCOUNTER — Telehealth: Payer: Self-pay | Admitting: Psychiatry

## 2022-05-11 NOTE — Telephone Encounter (Signed)
Jessica's patient asking for a few lorazepam or early RF due to a family crisis.  He said it was a personal issue with his parents. He frequently asks for an early RF.  He has complained of not being able to sleep without it and Jessica sent in Rx for trazodone.  He is aware that Shanda Bumps it out of the office until Monday. I have not pended a RF.  Not due for a RF for 6 more days. Does not have a RF available.   04/19/2022 02/21/2022 2  Lorazepam 0.5 Mg Tablet 30.00 30 Je Car [id number] [facility] ([id number]) 2/2 0.50 LME Private Pay [facility] 03/23/2022 02/21/2022 2  Lorazepam 0.5 Mg Tablet 30.00 30 Je Car [id number] Wal ([id number]) 1/2 0.50 LME Private Pay [facility] 02/22/2022 02/21/2022 2  Lorazepam 0.5 Mg Tablet 30.00 30 Je Car [id number] Wal [phone]) 0/2 0.50 LME Private Pay [facility] 01/28/2022 12/05/2021 2  Lorazepam 0.5 Mg Tablet 30.00 30 Je Car [id number] Wal ([id number]) 2/2 0.50 LME Private Pay [facility] 01/01/2022 12/05/2021 2  Lorazepam 0.5 Mg Tablet 30.00 30 Je Car [id number] Wal ([id number]) 1/2 0.50 LME Private Pay [facility] 12/05/2021 12/05/2021 2  Lorazepam 0.5 Mg Tablet 30.00 30 Je Car [id number] Wal [phone]) 0/2 0.50 LME Private Pay [facility] 11/03/2021 08/11/2021 2  Lorazepam 0.5 Mg Tablet 30.00 30 Je Car [id number] Wal ([id number]) 0/0 0.50 LME Private Pay [facility] 10/07/2021 08/11/2021 2  Lorazepam 0.5 Mg Tablet 30.00 30 Je Car [id number] [facility] ([id number]) 1/2 0.50 LME Comm Ins

## 2022-05-11 NOTE — Telephone Encounter (Signed)
Edward Singh called today at 3:04 to request of his Ativan.  He is out early because he had a family crisis and took some extra.  He said he wasn't due for refill until next week.  Wanted to know if you could prescribe a few to tie over until he can get the refill or just approve an early refill.  Has telephone appt 05/23/22.[name] on [address]

## 2022-05-11 NOTE — Telephone Encounter (Signed)
Reviewed patient's chart and history.  As noted there are repeated efforts to ask for lorazepam refills early.  While the dosages low, it appears that the provider is limiting the lorazepam dosage because the patient has a history of alcohol dependence.  I will not agree to an early refill of the lorazepam.  He will have to wait until his provider returns next week.

## 2022-05-11 NOTE — Telephone Encounter (Signed)
Called patient, no answer and mailbox is full. Patient asks for early refills frequently.

## 2022-05-12 NOTE — Telephone Encounter (Signed)
Patient notified that he will need to wait until Shanda Bumps returns to address this.

## 2022-05-13 ENCOUNTER — Other Ambulatory Visit: Payer: Self-pay | Admitting: Psychiatry

## 2022-05-13 DIAGNOSIS — F419 Anxiety disorder, unspecified: Secondary | ICD-10-CM

## 2022-05-15 NOTE — Telephone Encounter (Signed)
Last filled 6/14, due 7/12 

## 2022-05-15 NOTE — Telephone Encounter (Signed)
Pt called checking on the staus of this refill

## 2022-05-15 NOTE — Telephone Encounter (Signed)
He will need to wait until 05/17/22 to fill.

## 2022-05-17 ENCOUNTER — Other Ambulatory Visit: Payer: Self-pay

## 2022-05-17 NOTE — Telephone Encounter (Signed)
This has been pended and the note says okay to fill on the 12th but no one has signed off on it.

## 2022-05-23 ENCOUNTER — Ambulatory Visit (INDEPENDENT_AMBULATORY_CARE_PROVIDER_SITE_OTHER): Payer: Self-pay | Admitting: Psychiatry

## 2022-05-23 ENCOUNTER — Encounter: Payer: Self-pay | Admitting: Psychiatry

## 2022-05-23 VITALS — BP 130/82 | HR 68

## 2022-05-23 DIAGNOSIS — G4700 Insomnia, unspecified: Secondary | ICD-10-CM

## 2022-05-23 DIAGNOSIS — F419 Anxiety disorder, unspecified: Secondary | ICD-10-CM

## 2022-05-23 DIAGNOSIS — F331 Major depressive disorder, recurrent, moderate: Secondary | ICD-10-CM

## 2022-05-23 MED ORDER — DULOXETINE HCL 60 MG PO CPEP: 60.0000 mg | ORAL_CAPSULE | Freq: Every day | ORAL | 0 refills | Status: DC

## 2022-05-23 MED ORDER — LORAZEPAM 0.5 MG PO TABS: ORAL_TABLET | ORAL | 3 refills | Status: DC

## 2022-05-23 MED ORDER — DOXEPIN HCL 10 MG PO CAPS: 10.0000 mg | ORAL_CAPSULE | Freq: Every evening | ORAL | 2 refills | Status: DC | PRN

## 2022-05-23 NOTE — Progress Notes (Signed)
Edward Singh [id number] 10-13-1986 36 y.o.  Virtual Visit via Telephone Note I connected with pt on 05/25/22 at  9:00 AM EDT by telephone and verified that I am speaking with the correct person using two identifiers.   I discussed the limitations, risks, security and privacy concerns of performing an evaluation and management service by telephone and the availability of in person appointments. I also discussed with the patient that there may be a patient responsible charge related to this service. The patient expressed understanding and agreed to proceed.   I discussed the assessment and treatment plan with the patient. The patient was provided an opportunity to ask questions and all were answered. The patient agreed with the plan and demonstrated an understanding of the instructions.   The patient was advised to call back or seek an in-person evaluation if the symptoms worsen or if the condition fails to improve as anticipated.  I provided 25 minutes of non-face-to-face time during this encounter.  The patient was located at home.  The provider was located at [facility].   Corie Chiquito, PMHNP   Subjective:   Patient ID:  Edward Singh is a 36 y.o. (DOB [birth year]) male.  Chief Complaint:  Chief Complaint  Patient presents with   Anxiety   Insomnia   Depression    HPI Edward Singh presents for follow-up of anxiety, depression, insomnia, and h/o ETOH dependence.   He reports that there have been multiple stressors in the last month. 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 He was out of work for 2 weeks. Some extended family members were trying to take the house where he is currently living and has done multiple repairs.   He reports that he has been having panic attacks almost daily for the last month. He reports  anxiety has been higher. He reports that he used Ativan prn to help with anxiety with travel and has been able to travel more. He reports that he was taking Ativan BID for several days. He also was taking it to help with sleep. He has not taken Ativan for the last couple of days. Denies misusing Ativan and denies taking Ativan more than twice a day. He reports that he had excessive somnolence with Trazodone 50 mg po QHS. He reports that his sleep is improving now that he is working some and back into a routine. He reports that his sleep was significantly disrupted for 2 weeks while he was out of work. Appetite was decreased for a few days during height of stressors and appetite has improved and returned to baseline. He has had some depression in response to stressors. Rates energy and motivation 5 or 6 out of 10 with 10=most possible. He reports that his concentration is fair. He had some diminished interest for awhile and this has improved. Denies SI.   He denies relapse on ETOH and that Ativan seemed to help prevent relapse. He has been going to AA and NA meetings regularly.   "The whole last year was great until this month."  Daughter will be 20 yo in August.   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   Past Psychiatric Medication Trials: Cymbalta Prozac- Initially effective and then no longer as effective. Felt tired Lexapro- Drowsiness Effexor- Seemed to have helped initially and then started drinking  and taking less consistently.  Buspar Abilify Propranolol-May have been effective for anxiety Librium- prescribed during detox. Trazodone-Effective. Excessive daytime somnolence. Vistaril- ineffective Ambien May have taken Adderall in school. Zzzquil-somewhat helpful   Review of Systems:  Review of Systems  Cardiovascular:  Negative for palpitations.  Musculoskeletal:  Negative for gait problem.  Neurological:  Positive for headaches. Negative for tremors.   Psychiatric/Behavioral:         Please refer to HPI  Had recent physical exam. He reports that his glucose level was slightly elevated, however he was not fasting.   Medications: I have reviewed the patient's current medications.  Current Outpatient Medications  Medication Sig Dispense Refill   amLODipine (NORVASC) 10 MG tablet Take 10 mg by mouth daily.     doxepin (SINEQUAN) 10 MG capsule Take 1 capsule (10 mg total) by mouth at bedtime as needed. 30 capsule 2   Multiple Vitamin (MULTIVITAMIN) tablet Take 1 tablet by mouth daily.     pantoprazole (PROTONIX) 20 MG tablet TAKE ONE TABLET BY MOUTH DAILY (Patient taking differently: daily as needed.) 30 tablet 3   DULoxetine (CYMBALTA) 60 MG capsule Take 1 capsule (60 mg total) by mouth daily. 90 capsule 0   [START ON 06/06/2022] LORazepam (ATIVAN) 0.5 MG tablet TAKE ONE TABLET BY MOUTH TWICE DAILY AS NEEDED FOR ANXIETY 45 tablet 3   No current facility-administered medications for this visit.    Medication Side Effects: None  Allergies: No Known Allergies  Past Medical History:  Diagnosis Date   Alcoholism (HCC)    Anxiety    Broken arm    Depression    GERD (gastroesophageal reflux disease)    High cholesterol    Hypertension    Panic attacks     Family History  Problem Relation Age of Onset   Healthy Mother    Healthy Father     Social History   Socioeconomic History   Marital status: Single    Spouse name: Not on file   Number of children: Not on file   Years of education: Not on file   Highest education level: Not on file  Occupational History   Not on file  Tobacco Use   Smoking status: Former   Smokeless tobacco: Former    Types: Associate Professor Use: Never used  Substance and Sexual Activity   Alcohol use: Not Currently   Drug use: No   Sexual activity: Yes    Birth control/protection: None  Other Topics Concern   Not on file  Social History Narrative   Not on file   Social Determinants  of Health   Financial Resource Strain: Not on file  Food Insecurity: Not on file  Transportation Needs: Not on file  Physical Activity: Not on file  Stress: Not on file  Social Connections: Not on file  Intimate Partner Violence: Not on file    Past Medical History, Surgical history, Social history, and Family history were reviewed and updated as appropriate.   Please see review of systems for further details on the patient's review from today.   Objective:   Physical Exam:  BP 130/82   Pulse 68   Physical Exam Neurological:     Mental Status: He is alert and oriented to person, place, and time.     Cranial Nerves: No dysarthria.  Psychiatric:        Attention and Perception: Attention and perception normal.        Mood and  Affect: Mood is anxious.        Speech: Speech normal.        Behavior: Behavior is cooperative.        Thought Content: Thought content normal. Thought content is not paranoid or delusional. Thought content does not include homicidal or suicidal ideation. Thought content does not include homicidal or suicidal plan.        Cognition and Memory: Cognition and memory normal.        Judgment: Judgment normal.     Comments: Insight intact Some sadness in response to family conflict and stressors     Lab Review:     Component Value Date/Time   NA 136 11/26/2020 1258   K 3.7 11/26/2020 1258   CL 101 11/26/2020 1258   CO2 26 03/16/2014 1225   GLUCOSE 106 (H) 11/26/2020 1258   BUN 11 11/26/2020 1258   CREATININE 0.80 11/26/2020 1258   CALCIUM 9.3 03/16/2014 1225   PROT 8.2 03/16/2014 1225   ALBUMIN 4.7 03/16/2014 1225   AST 63 (H) 03/16/2014 1225   ALT 60 (H) 03/16/2014 1225   ALKPHOS 99 03/16/2014 1225   BILITOT 1.5 (H) 03/16/2014 1225   GFRNONAA >90 03/16/2014 1225   GFRAA >90 03/16/2014 1225       Component Value Date/Time   WBC 4.4 03/16/2014 1225   RBC 5.57 03/16/2014 1225   HGB 15.0 11/26/2020 1258   HCT 44.0 11/26/2020 1258   PLT 239  03/16/2014 1225   MCV 87.3 03/16/2014 1225   MCH 31.1 03/16/2014 1225   MCHC 35.6 03/16/2014 1225   RDW 12.1 03/16/2014 1225   LYMPHSABS 1.7 03/16/2014 1225   MONOABS 0.3 03/16/2014 1225   EOSABS 0.0 03/16/2014 1225   BASOSABS 0.0 03/16/2014 1225    No results found for: "POCLITH", "LITHIUM"   No results found for: "PHENYTOIN", "PHENOBARB", "VALPROATE", "CBMZ"   .res Assessment: Plan:    Pt seen for 25 minutes and time spent discussing recent acute stressor with family conflict. Discussed Ativan prn and he reports that he is occasionally needing ativan to help with sleep initiation in addition to panic symptoms and that he needed Ativan prn more during period of acute stress. Will increase quantity of Ativan to #45 a month. Discussed trial of Doxepin 10 mg po QHS prn insomnia to use instead of Ativan prn for insomnia to help prevent tolerance and dependence on Ativan. Pt agrees to trial of Ativan.  Continue Duloxetine 60 mg po qd for anxiety and depression.  Pt to follow-up in 3 months or sooner if clinically indicated.  Patient advised to contact office with any questions, adverse effects, or acute worsening in signs and symptoms.   Ulysse was seen today for anxiety, insomnia and depression.  Diagnoses and all orders for this visit:  Anxiety -     LORazepam (ATIVAN) 0.5 MG tablet; TAKE ONE TABLET BY MOUTH TWICE DAILY AS NEEDED FOR ANXIETY -     DULoxetine (CYMBALTA) 60 MG capsule; Take 1 capsule (60 mg total) by mouth daily.  Moderate episode of recurrent major depressive disorder (HCC) -     DULoxetine (CYMBALTA) 60 MG capsule; Take 1 capsule (60 mg total) by mouth daily.  Insomnia, unspecified type -     doxepin (SINEQUAN) 10 MG capsule; Take 1 capsule (10 mg total) by mouth at bedtime as needed.    Please see After Visit Summary for patient specific instructions.  Future Appointments  Date Time Provider Department Center  08/24/2022  1:00 PM Corie Chiquito, PMHNP  CP-CP None    No orders of the defined types were placed in this encounter.     -------------------------------

## 2022-06-05 ENCOUNTER — Other Ambulatory Visit: Payer: Self-pay | Admitting: Psychiatry

## 2022-06-05 DIAGNOSIS — F419 Anxiety disorder, unspecified: Secondary | ICD-10-CM

## 2022-06-06 NOTE — Telephone Encounter (Signed)
Last filled 7/12, due 8/9 

## 2022-07-02 ENCOUNTER — Other Ambulatory Visit: Payer: Self-pay | Admitting: Psychiatry

## 2022-07-02 DIAGNOSIS — F331 Major depressive disorder, recurrent, moderate: Secondary | ICD-10-CM

## 2022-07-02 DIAGNOSIS — F419 Anxiety disorder, unspecified: Secondary | ICD-10-CM

## 2022-07-04 DIAGNOSIS — D1801 Hemangioma of skin and subcutaneous tissue: Secondary | ICD-10-CM | POA: Diagnosis not present

## 2022-07-04 DIAGNOSIS — D2262 Melanocytic nevi of left upper limb, including shoulder: Secondary | ICD-10-CM | POA: Diagnosis not present

## 2022-07-04 DIAGNOSIS — L720 Epidermal cyst: Secondary | ICD-10-CM | POA: Diagnosis not present

## 2022-08-03 DIAGNOSIS — L720 Epidermal cyst: Secondary | ICD-10-CM | POA: Diagnosis not present

## 2022-08-24 ENCOUNTER — Ambulatory Visit (INDEPENDENT_AMBULATORY_CARE_PROVIDER_SITE_OTHER): Payer: Self-pay | Admitting: Psychiatry

## 2022-08-24 ENCOUNTER — Encounter: Payer: Self-pay | Admitting: Psychiatry

## 2022-08-24 DIAGNOSIS — F331 Major depressive disorder, recurrent, moderate: Secondary | ICD-10-CM

## 2022-08-24 DIAGNOSIS — F419 Anxiety disorder, unspecified: Secondary | ICD-10-CM

## 2022-08-24 MED ORDER — BUPROPION HCL ER (XL) 150 MG PO TB24: 150.0000 mg | ORAL_TABLET | Freq: Every day | ORAL | 2 refills | Status: DC

## 2022-08-24 MED ORDER — DULOXETINE HCL 60 MG PO CPEP: 60.0000 mg | ORAL_CAPSULE | Freq: Every day | ORAL | 0 refills | Status: DC

## 2022-08-24 MED ORDER — LORAZEPAM 0.5 MG PO TABS: ORAL_TABLET | ORAL | 3 refills | Status: DC

## 2022-08-24 NOTE — Progress Notes (Signed)
Edward Singh [id number] Aug 14, 1986 36 y.o.  Virtual Visit via Telephone Note  I connected with pt on 08/24/22 at  1:00 PM EDT by telephone and verified that I am speaking with the correct person using two identifiers.   I discussed the limitations, risks, security and privacy concerns of performing an evaluation and management service by telephone and the availability of in person appointments. I also discussed with the patient that there may be a patient responsible charge related to this service. The patient expressed understanding and agreed to proceed.   I discussed the assessment and treatment plan with the patient. The patient was provided an opportunity to ask questions and all were answered. The patient agreed with the plan and demonstrated an understanding of the instructions.   The patient was advised to call back or seek an in-person evaluation if the symptoms worsen or if the condition fails to improve as anticipated.  I provided 18 minutes of non-face-to-face time during this encounter.  The patient was located at home.  The provider was located at [place].   Thayer Headings, PMHNP   Subjective:   Patient ID:  Edward Singh is a 36 y.o. (DOB 06-10-1986) male.  Chief Complaint:  Chief Complaint  Patient presents with   Anxiety   Depression    HPI Edward Singh presents for follow-up of anxiety, depression, and insomnia. 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 He reports that he is trying to find a full-time job.   He reports that he has been sad about family conflict and no longer having contact with some family members. He reports some depression. He reports low energy and motivation. He reports that he has some anxiety and worry. He reports that he had a panic attack a few days ago and felt nauseous for awhile. He reports panic attacks are occurring about weekly. Sleep varies from hypersomnolence to  insomnia. He reports that appetite is ok. He reports some difficulty with concentration. Denies SI.   Denies any recent ETOH use. He reports that he has not been to AA or NA in about a month.   He reports that he has a cold recently.   He is currently not seeing a therapist.   He reports that he recently has been taking Ativan twice daily. Ativan last filled 08/10/22. X4   Past Psychiatric Medication Trials: Cymbalta- Unable to tolerate 90 mg.  Prozac- Initially effective and then no longer as effective. Felt tired Lexapro- Drowsiness Effexor- Seemed to have helped initially and then started drinking and taking less consistently.  Buspar Abilify- headaches and muscle twitches Propranolol-May have been effective for anxiety Librium- prescribed during detox. Trazodone-Effective. Excessive daytime somnolence Doxepin- Felt groggy the next day Vistaril- ineffective Ambien May have taken Adderall in school. Zzzquil-somewhat helpful    Review of Systems:  Review of Systems  Gastrointestinal:        Occ GI s/s with anxiety  Musculoskeletal:  Negative for gait problem.  Psychiatric/Behavioral:         Please refer to HPI    Medications: I have reviewed the patient's current medications.  Current Outpatient Medications  Medication Sig Dispense Refill   amLODipine (NORVASC) 10 MG tablet Take 10 mg by mouth daily.     buPROPion (WELLBUTRIN XL) 150 MG 24 hr tablet Take 1 tablet (150 mg total) by mouth daily. 30 tablet 2   Multiple Vitamin (MULTIVITAMIN) tablet Take 1 tablet by  mouth daily.     pantoprazole (PROTONIX) 20 MG tablet TAKE ONE TABLET BY MOUTH DAILY (Patient taking differently: daily as needed.) 30 tablet 3   DULoxetine (CYMBALTA) 60 MG capsule Take 1 capsule (60 mg total) by mouth daily. 90 capsule 0   [START ON 08/26/2022] LORazepam (ATIVAN) 0.5 MG tablet TAKE ONE TABLET BY MOUTH TWICE DAILY AS NEEDED FOR ANXIETY 60 tablet 3   No current facility-administered medications  for this visit.    Medication Side Effects: None  Allergies: No Known Allergies  Past Medical History:  Diagnosis Date   Alcoholism (HCC)    Anxiety    Broken arm    Depression    GERD (gastroesophageal reflux disease)    High cholesterol    Hypertension    Panic attacks     Family History  Problem Relation Age of Onset   Healthy Mother    Healthy Father     Social History   Socioeconomic History   Marital status: Single    Spouse name: Not on file   Number of children: Not on file   Years of education: Not on file   Highest education level: Not on file  Occupational History   Not on file  Tobacco Use   Smoking status: Former   Smokeless tobacco: Former    Types: Associate Professor Use: Never used  Substance and Sexual Activity   Alcohol use: Not Currently   Drug use: No   Sexual activity: Yes    Birth control/protection: None  Other Topics Concern   Not on file  Social History Narrative   Not on file   Social Determinants of Health   Financial Resource Strain: Not on file  Food Insecurity: Not on file  Transportation Needs: Not on file  Physical Activity: Not on file  Stress: Not on file  Social Connections: Not on file  Intimate Partner Violence: Not on file    Past Medical History, Surgical history, Social history, and Family history were reviewed and updated as appropriate.   Please see review of systems for further details on the patient's review from today.   Objective:   Physical Exam:  BP 120/80   Wt 190 lb (86.2 kg)   BMI 28.06 kg/m   Physical Exam Neurological:     Mental Status: He is alert and oriented to person, place, and time.     Cranial Nerves: No dysarthria.  Psychiatric:        Attention and Perception: Attention and perception normal.        Mood and Affect: Mood is anxious and depressed.        Speech: Speech normal.        Behavior: Behavior is cooperative.        Thought Content: Thought content normal.  Thought content is not paranoid or delusional. Thought content does not include homicidal or suicidal ideation. Thought content does not include homicidal or suicidal plan.        Cognition and Memory: Cognition and memory normal.        Judgment: Judgment normal.     Comments: Insight intact     Lab Review:     Component Value Date/Time   NA 136 11/26/2020 1258   K 3.7 11/26/2020 1258   CL 101 11/26/2020 1258   CO2 26 03/16/2014 1225   GLUCOSE 106 (H) 11/26/2020 1258   BUN 11 11/26/2020 1258   CREATININE 0.80 11/26/2020 1258   CALCIUM  9.3 03/16/2014 1225   PROT 8.2 03/16/2014 1225   ALBUMIN 4.7 03/16/2014 1225   AST 63 (H) 03/16/2014 1225   ALT 60 (H) 03/16/2014 1225   ALKPHOS 99 03/16/2014 1225   BILITOT 1.5 (H) 03/16/2014 1225   GFRNONAA >90 03/16/2014 1225   GFRAA >90 03/16/2014 1225       Component Value Date/Time   WBC 4.4 03/16/2014 1225   RBC 5.57 03/16/2014 1225   HGB 15.0 11/26/2020 1258   HCT 44.0 11/26/2020 1258   PLT 239 03/16/2014 1225   MCV 87.3 03/16/2014 1225   MCH 31.1 03/16/2014 1225   MCHC 35.6 03/16/2014 1225   RDW 12.1 03/16/2014 1225   LYMPHSABS 1.7 03/16/2014 1225   MONOABS 0.3 03/16/2014 1225   EOSABS 0.0 03/16/2014 1225   BASOSABS 0.0 03/16/2014 1225    No results found for: "POCLITH", "LITHIUM"   No results found for: "PHENYTOIN", "PHENOBARB", "VALPROATE", "CBMZ"   .res Assessment: Plan:    Discussed potential benefits, risks, and side effects of Wellbutrin XL to target depressed mood, low energy, low motivation, and difficulty with concentration. Pt agrees to trial of Wellbutrin XL 150 mg po qd.  Continue Cymbalta 60 mg po qd for depression and anxiety.  Continue Lorazepam 0.5 mg po BID prn anxiety.  Pt to follow-up in 3 months or sooner if clinically indicated.  Patient advised to contact office with any questions, adverse effects, or acute worsening in signs and symptoms.   Edward Singh was seen today for anxiety and  depression.  Diagnoses and all orders for this visit:  Anxiety -     DULoxetine (CYMBALTA) 60 MG capsule; Take 1 capsule (60 mg total) by mouth daily. -     LORazepam (ATIVAN) 0.5 MG tablet; TAKE ONE TABLET BY MOUTH TWICE DAILY AS NEEDED FOR ANXIETY  Moderate episode of recurrent major depressive disorder (HCC) -     buPROPion (WELLBUTRIN XL) 150 MG 24 hr tablet; Take 1 tablet (150 mg total) by mouth daily. -     DULoxetine (CYMBALTA) 60 MG capsule; Take 1 capsule (60 mg total) by mouth daily.    Please see After Visit Summary for patient specific instructions.  Future Appointments  Date Time Provider Department Center  11/23/2022  1:30 PM Corie Chiquito, PMHNP CP-CP None    No orders of the defined types were placed in this encounter.     -------------------------------

## 2022-08-25 ENCOUNTER — Other Ambulatory Visit: Payer: Self-pay

## 2022-08-25 ENCOUNTER — Telehealth: Payer: Self-pay | Admitting: Psychiatry

## 2022-08-25 DIAGNOSIS — F419 Anxiety disorder, unspecified: Secondary | ICD-10-CM

## 2022-08-25 MED ORDER — LORAZEPAM 0.5 MG PO TABS: ORAL_TABLET | ORAL | 0 refills | Status: DC

## 2022-08-25 NOTE — Telephone Encounter (Signed)
Filled 10.5 due 11/1

## 2022-08-25 NOTE — Telephone Encounter (Signed)
Patient, Edward Singh, called to see if his RX for Ativan could be transferred to [facility] on [address], [place] not [facility]

## 2022-08-28 ENCOUNTER — Telehealth: Payer: Self-pay

## 2022-08-28 NOTE — Telephone Encounter (Signed)
Script was sent 08/25/22 for fill date of 08/26/22 to [facility].

## 2022-08-28 NOTE — Telephone Encounter (Signed)
Called patient and he was at the pharmacy.

## 2022-09-04 ENCOUNTER — Ambulatory Visit (INDEPENDENT_AMBULATORY_CARE_PROVIDER_SITE_OTHER): Payer: Self-pay | Admitting: Psychiatry

## 2022-09-04 DIAGNOSIS — Z91199 Patient's noncompliance with other medical treatment and regimen due to unspecified reason: Secondary | ICD-10-CM

## 2022-09-04 NOTE — Progress Notes (Signed)
Edward Singh [id number] 06/13/1986 36 y.o.  Virtual Visit via Telephone Note  Attempted to reach patient x 2 for scheduled telephone visit. No answer. Unable to leave message due to VM being full.

## 2022-09-07 ENCOUNTER — Ambulatory Visit (INDEPENDENT_AMBULATORY_CARE_PROVIDER_SITE_OTHER): Payer: BC Managed Care – PPO | Admitting: Psychiatry

## 2022-09-07 ENCOUNTER — Encounter: Payer: Self-pay | Admitting: Psychiatry

## 2022-09-07 DIAGNOSIS — F419 Anxiety disorder, unspecified: Secondary | ICD-10-CM | POA: Diagnosis not present

## 2022-09-07 DIAGNOSIS — F331 Major depressive disorder, recurrent, moderate: Secondary | ICD-10-CM | POA: Diagnosis not present

## 2022-09-07 MED ORDER — PROPRANOLOL HCL 10 MG PO TABS: ORAL_TABLET | ORAL | 1 refills | Status: DC

## 2022-09-07 MED ORDER — LORAZEPAM 0.5 MG PO TABS: 0.5000 mg | ORAL_TABLET | Freq: Two times a day (BID) | ORAL | 4 refills | Status: DC

## 2022-09-07 NOTE — Progress Notes (Signed)
Edward Singh [id number] 1986/04/19 36 y.o.  Subjective:   Patient ID:  Edward Singh is a 36 y.o. (DOB [birth year]) male.  Chief Complaint:  Chief Complaint  Patient presents with   Anxiety   Depression    Anxiety Symptoms include palpitations. Patient reports no chest pain.    Depression        Associated symptoms include headaches.  Past medical history includes anxiety.    Edward Singh presents to the office today for follow-up of depression, anxiety, and insomnia.   He reports, "I don't know if the meds I am on have stopped working." He reports high anxiety and depression. He reports sleeping excessively during the day. Low energy and motivation.   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 He and his father had a disagreement. He has had some panic attacks- "they're not full blown." Had some crying episodes and hyperventilating. He reports frequent worry and anxious thoughts. He was afraid to leave the house for a few days over the last week. Denies irritability. He reports excessive guilt.   "Probably taken a little more than I should have some days" with Lorazepam. Reports, "it's the only thing that really helps me." He reports drinking about 1-2 months ago.   Feelings of depersonalization and derealization.   Has been taking daughter to school and picking her up. "I am not very good when I am idle." Appetite has been fair. Hungrier at night than during the day. Difficulty with concentration. Recent hopeless thoughts. Denies SI.   Racing thoughts. Increased HR and BP have been elevated. Sees PCP tomorrow.   He reports that he has not done as well since he had COVID and some post-covid symptoms.   Has not had a sponsor in a long time. May try to start going to AA meetings again. Denies any other substance use.   Lorazepam last filled 08/28/22.   Some marital stress in response to financial issues. He has tried  working a few jobs and was not satisfied with these jobs. 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   Grandparents are supportive.   Past Psychiatric Medication Trials: Cymbalta- Unable to tolerate 90 mg.  Prozac- Initially effective and then no longer as effective. Felt tired Lexapro- Drowsiness Effexor- Seemed to have helped initially and then started drinking and taking less consistently.  Buspar Abilify- headaches and muscle twitches Propranolol-May have been effective for anxiety Librium- prescribed during detox. Trazodone-Effective. Excessive daytime somnolence Doxepin- Felt groggy the next day Vistaril- ineffective Ambien May have taken Adderall in school. Zzzquil-somewhat helpful     Flowsheet Row ED from 03/24/2021 in [facility] ED from 11/26/2020 in [facility]  C-SSRS RISK CATEGORY No Risk No Risk        Review of Systems:  Review of Systems  Cardiovascular:  Positive for palpitations. Negative for chest pain.  Musculoskeletal:  Negative for gait problem.  Neurological:  Positive for headaches. Negative for tremors.  Psychiatric/Behavioral:  Positive for depression.        Please refer to HPI    Medications: I have reviewed the patient's current medications.  Current Outpatient Medications  Medication Sig Dispense Refill   amLODipine (NORVASC) 10 MG tablet Take 10 mg by mouth daily.     Brexpiprazole (REXULTI) 0.5 MG TABS Take 1 tablet (0.5 mg total) by mouth daily. 30 tablet 0   DULoxetine (CYMBALTA) 60 MG capsule Take 1  capsule (60 mg total) by mouth daily. 90 capsule 0   Multiple Vitamin (MULTIVITAMIN) tablet Take 1 tablet by mouth daily.     pantoprazole (PROTONIX) 20 MG tablet TAKE ONE TABLET BY MOUTH DAILY (Patient taking differently: daily as needed.) 30 tablet 3   [START ON 09/25/2022] LORazepam (ATIVAN) 0.5 MG tablet Take 1 tablet (0.5 mg total) by mouth 2 (two) times daily for 14 days.  28 tablet 4   propranolol (INDERAL) 10 MG tablet Take 1-2 tabs po BID prn anxiety 120 tablet 1   No current facility-administered medications for this visit.    Medication Side Effects: None  Allergies: No Known Allergies  Past Medical History:  Diagnosis Date   Alcoholism (HCC)    Anxiety    Broken arm    Depression    GERD (gastroesophageal reflux disease)    High cholesterol    Hypertension    Panic attacks     Past Medical History, Surgical history, Social history, and Family history were reviewed and updated as appropriate.   Please see review of systems for further details on the patient's review from today.   Objective:   Physical Exam:  There were no vitals taken for this visit.  Physical Exam Constitutional:      General: He is not in acute distress. Musculoskeletal:        General: No deformity.  Neurological:     Mental Status: He is alert and oriented to person, place, and time.     Coordination: Coordination normal.  Psychiatric:        Attention and Perception: Attention and perception normal. He does not perceive auditory or visual hallucinations.        Mood and Affect: Mood is anxious and depressed. Affect is not labile, blunt, angry or inappropriate.        Speech: Speech normal.        Behavior: Behavior normal.        Thought Content: Thought content normal. Thought content is not paranoid or delusional. Thought content does not include homicidal or suicidal ideation. Thought content does not include homicidal or suicidal plan.        Cognition and Memory: Cognition and memory normal.        Judgment: Judgment normal.     Comments: Insight intact     Lab Review:     Component Value Date/Time   NA 136 11/26/2020 1258   K 3.7 11/26/2020 1258   CL 101 11/26/2020 1258   CO2 26 03/16/2014 1225   GLUCOSE 106 (H) 11/26/2020 1258   BUN 11 11/26/2020 1258   CREATININE 0.80 11/26/2020 1258   CALCIUM 9.3 03/16/2014 1225   PROT 8.2 03/16/2014 1225    ALBUMIN 4.7 03/16/2014 1225   AST 63 (H) 03/16/2014 1225   ALT 60 (H) 03/16/2014 1225   ALKPHOS 99 03/16/2014 1225   BILITOT 1.5 (H) 03/16/2014 1225   GFRNONAA >90 03/16/2014 1225   GFRAA >90 03/16/2014 1225       Component Value Date/Time   WBC 4.4 03/16/2014 1225   RBC 5.57 03/16/2014 1225   HGB 15.0 11/26/2020 1258   HCT 44.0 11/26/2020 1258   PLT 239 03/16/2014 1225   MCV 87.3 03/16/2014 1225   MCH 31.1 03/16/2014 1225   MCHC 35.6 03/16/2014 1225   RDW 12.1 03/16/2014 1225   LYMPHSABS 1.7 03/16/2014 1225   MONOABS 0.3 03/16/2014 1225   EOSABS 0.0 03/16/2014 1225   BASOSABS 0.0 03/16/2014 1225  No results found for: "POCLITH", "LITHIUM"   No results found for: "PHENYTOIN", "PHENOBARB", "VALPROATE", "CBMZ"   .res Assessment: Plan:    Pt seen for 30 minutes and time spent discussing treatment plan. Discussed that it may be helpful to see therapist/LCAS and he reports that he will consider this.  Discussed potential benefits, risks, and side effects with Rexulti for augmentation of depression. Pt agrees to trial of Rexulti. Discussed starting low dose due to history of adverse effects. Will start Rexulti 0.5 mg po qd. Advised pt to contact office with an update in 2-3 weeks. May consider increase in Rexulti to 1 mg daily if needed and well-tolerated.  Continue Cymbalta 60 mg po qd for anxiety and depression. Will consider switching Cymbalta to Prozac if s/s do not improve with Rexulti.  Discussed concerns about Lorazepam, history of addiction, and his concerns about withdrawal. Discussed that Lorazepam could be gradually reduced to minimize risk of benzodiazepine withdrawal. He reports that he would like to wait until after the holidays to reduce Lorazepam. Discussed changing Lorazepam to two-week supplies at a time to minimize risk of taking more than prescribed. Continue Lorazepam 0.5 mg po BID in 2-week supplies until mid-January.  Discussed using Propranolol 10 mg 1-2  tab po BID prn anxiety to help manage anxiety and possibly reduce need for Ativan prn.  Pt to follow-up in 2 months or sooner if clinically indicated.  Patient advised to contact office with any questions, adverse effects, or acute worsening in signs and symptoms.    Montee was seen today for anxiety and depression.  Diagnoses and all orders for this visit:  Anxiety -     LORazepam (ATIVAN) 0.5 MG tablet; Take 1 tablet (0.5 mg total) by mouth 2 (two) times daily for 14 days. -     propranolol (INDERAL) 10 MG tablet; Take 1-2 tabs po BID prn anxiety  Moderate episode of recurrent major depressive disorder (HCC) -     Brexpiprazole (REXULTI) 0.5 MG TABS; Take 1 tablet (0.5 mg total) by mouth daily.     Please see After Visit Summary for patient specific instructions.  Future Appointments  Date Time Provider [place]  11/23/2022  1:30 PM Thayer Headings, PMHNP CP-CP None    No orders of the defined types were placed in this encounter.   -------------------------------

## 2022-09-08 DIAGNOSIS — I10 Essential (primary) hypertension: Secondary | ICD-10-CM | POA: Diagnosis not present

## 2022-09-08 DIAGNOSIS — F339 Major depressive disorder, recurrent, unspecified: Secondary | ICD-10-CM | POA: Diagnosis not present

## 2022-09-08 DIAGNOSIS — R7303 Prediabetes: Secondary | ICD-10-CM | POA: Diagnosis not present

## 2022-09-08 DIAGNOSIS — F1011 Alcohol abuse, in remission: Secondary | ICD-10-CM | POA: Diagnosis not present

## 2022-09-08 MED ORDER — REXULTI 0.5 MG PO TABS: 0.5000 mg | ORAL_TABLET | Freq: Every day | ORAL | 0 refills | Status: DC

## 2022-09-22 ENCOUNTER — Other Ambulatory Visit: Payer: Self-pay | Admitting: Psychiatry

## 2022-09-22 DIAGNOSIS — F419 Anxiety disorder, unspecified: Secondary | ICD-10-CM

## 2022-09-30 ENCOUNTER — Other Ambulatory Visit: Payer: Self-pay | Admitting: Psychiatry

## 2022-09-30 DIAGNOSIS — F331 Major depressive disorder, recurrent, moderate: Secondary | ICD-10-CM

## 2022-09-30 DIAGNOSIS — F419 Anxiety disorder, unspecified: Secondary | ICD-10-CM

## 2022-11-23 ENCOUNTER — Ambulatory Visit: Payer: BC Managed Care – PPO | Admitting: Psychiatry

## 2022-11-23 ENCOUNTER — Encounter: Payer: Self-pay | Admitting: Psychiatry

## 2022-11-23 VITALS — BP 127/85 | HR 69

## 2022-11-23 DIAGNOSIS — F419 Anxiety disorder, unspecified: Secondary | ICD-10-CM

## 2022-11-23 DIAGNOSIS — F3341 Major depressive disorder, recurrent, in partial remission: Secondary | ICD-10-CM | POA: Diagnosis not present

## 2022-11-23 DIAGNOSIS — G4700 Insomnia, unspecified: Secondary | ICD-10-CM | POA: Diagnosis not present

## 2022-11-23 MED ORDER — LORAZEPAM 0.5 MG PO TABS: 0.5000 mg | ORAL_TABLET | Freq: Two times a day (BID) | ORAL | 5 refills | Status: DC

## 2022-11-23 MED ORDER — DULOXETINE HCL 60 MG PO CPEP: 60.0000 mg | ORAL_CAPSULE | Freq: Every day | ORAL | 0 refills | Status: DC

## 2022-11-23 NOTE — Progress Notes (Signed)
Edward Singh [id number] August 14, 1986 37 y.o.  Subjective:   Patient ID:  Edward Singh is a 37 y.o. (DOB 1986-02-12) male.  Chief Complaint:  Chief Complaint  Patient presents with   Follow-up    Anxiety and depression    HPI [name] presents to the office today for follow-up of anxiety and depression. He reports that he is, "actually a lot better than last time. 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 He reports that depression has improved. He reports that sometimes he notices some periods of depressed mood. "That deep, dark depression has definitely lifted a lot." Energy and motivation have improved. He reports that anxiety "comes and goes." He reports that he has "always been a worrier." Some improvement in anxiety. Less irritability. He reports "maybe a couple" or panic attacks. He reports sleep and appetite have improved. Easier to get up in the morning. Excessive guilt has improved. Concentration has improved. He is now doing more tasks that require focus, such as entering orders on the computer. Denies SI.   He reports that he took Rexulti for 2-3 weeks and then had multiple middle of the night awakenings.   Denies alcohol use.   Using Lorazepam prn 1-2 times daily. He reports that he has preferred 2 week supply and has not ran out or felt like he has been misusing it.   He will be traveling in April to [place]. He has anxiety about traveling.   He reports that home life is good. Daughter is doing well. Daughter, Edward Singh, is now 44 years old. He reports that being on regular schedule has been helpful for his mental health.   Lorazepam last filled 11/21/22 x 5.   He has been going to NA a few times a week with his friend.   Past Psychiatric Medication Trials: Cymbalta- Unable to tolerate 90 mg.  Prozac- Initially effective and then no longer as effective. Felt  tired Lexapro- Drowsiness Effexor- Seemed to have helped initially and then started drinking and taking less consistently.  Buspar Abilify- headaches and muscle twitches Propranolol-May have been effective for anxiety Librium- prescribed during detox. Trazodone-Effective. Excessive daytime somnolence Doxepin- Felt groggy the next day Vistaril- ineffective Ambien May have taken Adderall in school. Zzzquil-somewhat helpful  Flowsheet Row ED from 03/24/2021 in [facility] Emergency Department at [facility] ED from 11/26/2020 in [facility] Emergency Department at [facility]  C-SSRS RISK CATEGORY No Risk No Risk        Review of Systems:  Review of Systems  Musculoskeletal:  Negative for gait problem.  Neurological:  Negative for headaches.  Psychiatric/Behavioral:         Please refer to HPI    Medications: I have reviewed the patient's current medications.  Current Outpatient Medications  Medication Sig Dispense Refill   amLODipine (NORVASC) 10 MG tablet Take 10 mg by mouth daily.     lisinopril (ZESTRIL) 10 MG tablet Take 10 mg by mouth daily.     Multiple Vitamin (MULTIVITAMIN) tablet Take 1 tablet by mouth daily.     pantoprazole (PROTONIX) 20 MG tablet TAKE ONE TABLET BY MOUTH DAILY (Patient taking differently: daily as needed.) 30 tablet 3   propranolol (INDERAL) 10 MG tablet TAKE 1 TO 2 TABLETS BY MOUTH TWICE A DAY AS NEEDED FOR ANXIETY 360 tablet 0   DULoxetine (CYMBALTA) 60 MG capsule Take  1 capsule (60 mg total) by mouth daily. 90 capsule 0   [START ON 12/05/2022] LORazepam (ATIVAN) 0.5 MG tablet Take 1 tablet (0.5 mg total) by mouth 2 (two) times daily for 14 days. 28 tablet 5   No current facility-administered medications for this visit.    Medication Side Effects: None  Allergies: No Known Allergies  Past Medical History:  Diagnosis Date   Alcoholism ([place])    Anxiety    Broken arm    Depression    GERD (gastroesophageal reflux disease)     High cholesterol    Hypertension    Panic attacks     Past Medical History, Surgical history, Social history, and Family history were reviewed and updated as appropriate.   Please see review of systems for further details on the patient's review from today.   Objective:   Physical Exam:  BP 127/85   Pulse 69   Physical Exam Constitutional:      General: He is not in acute distress. Musculoskeletal:        General: No deformity.  Neurological:     Mental Status: He is alert and oriented to person, place, and time.     Coordination: Coordination normal.  Psychiatric:        Attention and Perception: Attention and perception normal. He does not perceive auditory or visual hallucinations.        Mood and Affect: Mood is not depressed. Affect is not labile, blunt, angry or inappropriate.        Speech: Speech normal.        Behavior: Behavior normal.        Thought Content: Thought content normal. Thought content is not paranoid or delusional. Thought content does not include homicidal or suicidal ideation. Thought content does not include homicidal or suicidal plan.        Cognition and Memory: Cognition and memory normal.        Judgment: Judgment normal.     Comments: Insight intact Mood presents as significantly less anxious compared to last visit     Lab Review:     Component Value Date/Time   NA 136 11/26/2020 1258   K 3.7 11/26/2020 1258   CL 101 11/26/2020 1258   CO2 26 03/16/2014 1225   GLUCOSE 106 (H) 11/26/2020 1258   BUN 11 11/26/2020 1258   CREATININE 0.80 11/26/2020 1258   CALCIUM 9.3 03/16/2014 1225   PROT 8.2 03/16/2014 1225   ALBUMIN 4.7 03/16/2014 1225   AST 63 (H) 03/16/2014 1225   ALT 60 (H) 03/16/2014 1225   ALKPHOS 99 03/16/2014 1225   BILITOT 1.5 (H) 03/16/2014 1225   GFRNONAA >90 03/16/2014 1225   GFRAA >90 03/16/2014 1225       Component Value Date/Time   WBC 4.4 03/16/2014 1225   RBC 5.57 03/16/2014 1225   HGB 15.0 11/26/2020 1258    HCT 44.0 11/26/2020 1258   PLT 239 03/16/2014 1225   MCV 87.3 03/16/2014 1225   MCH 31.1 03/16/2014 1225   MCHC 35.6 03/16/2014 1225   RDW 12.1 03/16/2014 1225   LYMPHSABS 1.7 03/16/2014 1225   MONOABS 0.3 03/16/2014 1225   EOSABS 0.0 03/16/2014 1225   BASOSABS 0.0 03/16/2014 1225    No results found for: "POCLITH", "LITHIUM"   No results found for: "PHENYTOIN", "PHENOBARB", "VALPROATE", "CBMZ"   .res Assessment: Plan:    Pt seen for 30 minutes and time spent discussing response to medication changes and recent significant psychosocial changes. He  reports that there has been an overall improvement in his mood, anxiety, and circumstances. He reports that his mood and anxiety symptoms are currently manageable and he would like to continue current medications without changes. He reports that he prefers Lorazepam prn in 2-week supplies.  Will continue Cymbalta 60 mg daily for anxiety and depression.  Continue Propranolol prn anxiety.  Continue Lorazepam 0.5 mg po BID prn anxiety. Pt to follow-up in 4 months or sooner if clinically indicated.  Patient advised to contact office with any questions, adverse effects, or acute worsening in signs and symptoms.    Earvin was seen today for follow-up.  Diagnoses and all orders for this visit:  Anxiety -     DULoxetine (CYMBALTA) 60 MG capsule; Take 1 capsule (60 mg total) by mouth daily. -     LORazepam (ATIVAN) 0.5 MG tablet; Take 1 tablet (0.5 mg total) by mouth 2 (two) times daily for 14 days.  Recurrent major depressive disorder, in partial remission (HCC) -     DULoxetine (CYMBALTA) 60 MG capsule; Take 1 capsule (60 mg total) by mouth daily.  Insomnia, unspecified type     Please see After Visit Summary for patient specific instructions.  Future Appointments  Date Time Provider [place]  03/23/2023  9:00 AM Thayer Headings, PMHNP CP-CP None     No orders of the defined types were placed in this  encounter.   -------------------------------

## 2022-12-28 ENCOUNTER — Other Ambulatory Visit: Payer: Self-pay | Admitting: Psychiatry

## 2022-12-28 DIAGNOSIS — F419 Anxiety disorder, unspecified: Secondary | ICD-10-CM

## 2022-12-28 DIAGNOSIS — F3341 Major depressive disorder, recurrent, in partial remission: Secondary | ICD-10-CM

## 2023-02-20 ENCOUNTER — Other Ambulatory Visit: Payer: Self-pay | Admitting: Psychiatry

## 2023-02-27 ENCOUNTER — Other Ambulatory Visit: Payer: Self-pay | Admitting: Psychiatry

## 2023-02-27 DIAGNOSIS — F419 Anxiety disorder, unspecified: Secondary | ICD-10-CM

## 2023-03-16 ENCOUNTER — Other Ambulatory Visit: Payer: Self-pay | Admitting: Psychiatry

## 2023-03-16 DIAGNOSIS — F419 Anxiety disorder, unspecified: Secondary | ICD-10-CM

## 2023-03-16 DIAGNOSIS — F3341 Major depressive disorder, recurrent, in partial remission: Secondary | ICD-10-CM

## 2023-03-18 NOTE — Telephone Encounter (Signed)
He is taking

## 2023-03-23 ENCOUNTER — Ambulatory Visit (INDEPENDENT_AMBULATORY_CARE_PROVIDER_SITE_OTHER): Payer: BC Managed Care – PPO | Admitting: Psychiatry

## 2023-03-23 ENCOUNTER — Encounter: Payer: Self-pay | Admitting: Psychiatry

## 2023-03-23 DIAGNOSIS — F3341 Major depressive disorder, recurrent, in partial remission: Secondary | ICD-10-CM

## 2023-03-23 DIAGNOSIS — F419 Anxiety disorder, unspecified: Secondary | ICD-10-CM | POA: Diagnosis not present

## 2023-03-23 MED ORDER — DULOXETINE HCL 60 MG PO CPEP: 60.0000 mg | ORAL_CAPSULE | Freq: Every day | ORAL | 0 refills | Status: DC

## 2023-03-23 MED ORDER — LORAZEPAM 0.5 MG PO TABS: ORAL_TABLET | ORAL | 5 refills | Status: DC

## 2023-03-23 NOTE — Progress Notes (Signed)
Edward Singh [id number] Mar 29, 1986 37 y.o.  Virtual Visit via Telephone Note  I connected with pt on 03/23/23 at  9:00 AM EDT by telephone and verified that I am speaking with the correct person using two identifiers.   I discussed the limitations, risks, security and privacy concerns of performing an evaluation and management service by telephone and the availability of in person appointments. I also discussed with the patient that there may be a patient responsible charge related to this service. The patient expressed understanding and agreed to proceed.   I discussed the assessment and treatment plan with the patient. The patient was provided an opportunity to ask questions and all were answered. The patient agreed with the plan and demonstrated an understanding of the instructions.   The patient was advised to call back or seek an in-person evaluation if the symptoms worsen or if the condition fails to improve as anticipated.  I provided 15 minutes of non-face-to-face time during this encounter.  The patient was located at work in [facility].  The provider was located at [facility].   Corie Chiquito, PMHNP   Subjective:   Patient ID:  Edward Singh is a 37 y.o. (DOB 1986-04-10) male.  Chief Complaint:  Chief Complaint  Patient presents with   Follow-up    Depression and anxiety.     HPI Edward Singh presents for follow-up of anxiety and depression. He reports that he has been "busy" at work. He reports, "it's a lot of stress. I still feel a lot of pressure." He reports occasional anxiety and it "comes and goes... some days I don't have any" and other days anxiety is higher. Denies depressed mood. He reports that energy and motivation have been good other than feeling tired in response to working long hours. He reports that his sleep varies, "most of the time I sleep pretty decently." He reports that he wakes up periodically. He reports that his appetite has  been good. Concentration has been adequate. Denies SI.   He reports that he usually takes Lorazepam towards the end of the day when he starts to feel "more nervous and stressed." Occasionally has needed Lorazepam prn after waking up anxious. He reports that he usually takes 1-2 tabs daily. Denies taking more than prescribed.   Denies any recent alcohol use.   He is working 9-10 hours, 6 days a week.   Lorazepam last filled 03/19/23.  Past Psychiatric Medication Trials: Cymbalta- Unable to tolerate 90 mg.  Prozac- Initially effective and then no longer as effective. Felt tired Lexapro- Drowsiness Effexor- Seemed to have helped initially and then started drinking and taking less consistently.  Buspar Abilify- headaches and muscle twitches Propranolol-May have been effective for anxiety Librium- prescribed during detox. Trazodone-Effective. Excessive daytime somnolence Doxepin- Felt groggy the next day Vistaril- ineffective Ambien May have taken Adderall in school. Zzzquil-somewhat helpful  Review of Systems:  Review of Systems  Gastrointestinal: Negative.   Musculoskeletal:  Negative for gait problem.  Neurological:  Negative for tremors.  Psychiatric/Behavioral:         Please refer to HPI    Medications: I have reviewed the patient's current medications.  Current Outpatient Medications  Medication Sig Dispense Refill   lisinopril (ZESTRIL) 10 MG tablet Take 10 mg by mouth daily.     Multiple Vitamin (MULTIVITAMIN) tablet Take 1 tablet by mouth daily.     pantoprazole (PROTONIX) 20 MG tablet TAKE ONE TABLET BY MOUTH DAILY 30 tablet 3   propranolol (  INDERAL) 10 MG tablet TAKE 1 TO 2 TABLETS BY MOUTH TWICE A DAY AS NEEDED FOR ANXIETY 360 tablet 0   DULoxetine (CYMBALTA) 60 MG capsule Take 1 capsule (60 mg total) by mouth daily. 90 capsule 0   [START ON 04/16/2023] LORazepam (ATIVAN) 0.5 MG tablet TAKE 1 TABLET BY MOUTH TWICE A DAY FOR 14 DAYS *NO EARLY FILLS* 28 tablet 5   No  current facility-administered medications for this visit.    Medication Side Effects: None  Allergies: No Known Allergies  Past Medical History:  Diagnosis Date   Alcoholism (HCC)    Anxiety    Broken arm    Depression    GERD (gastroesophageal reflux disease)    High cholesterol    Hypertension    Panic attacks     Family History  Problem Relation Age of Onset   Healthy Mother    Healthy Father     Social History   Socioeconomic History   Marital status: Single    Spouse name: Not on file   Number of children: Not on file   Years of education: Not on file   Highest education level: Not on file  Occupational History   Not on file  Tobacco Use   Smoking status: Former   Smokeless tobacco: Former    Types: Associate Professor Use: Never used  Substance and Sexual Activity   Alcohol use: Not Currently   Drug use: No   Sexual activity: Yes    Birth control/protection: None  Other Topics Concern   Not on file  Social History Narrative   Not on file   Social Determinants of Health   Financial Resource Strain: Not on file  Food Insecurity: Not on file  Transportation Needs: Not on file  Physical Activity: Not on file  Stress: Not on file  Social Connections: Not on file  Intimate Partner Violence: Not on file    Past Medical History, Surgical history, Social history, and Family history were reviewed and updated as appropriate.   Please see review of systems for further details on the patient's review from today.   Objective:   Physical Exam:  BP 120/80   Pulse 75   Physical Exam Neurological:     Mental Status: He is alert and oriented to person, place, and time.     Cranial Nerves: No dysarthria.  Psychiatric:        Attention and Perception: Attention and perception normal.        Mood and Affect: Mood normal.        Speech: Speech normal.        Behavior: Behavior is cooperative.        Thought Content: Thought content normal. Thought  content is not paranoid or delusional. Thought content does not include homicidal or suicidal ideation. Thought content does not include homicidal or suicidal plan.        Cognition and Memory: Cognition and memory normal.        Judgment: Judgment normal.     Comments: Insight intact     Lab Review:     Component Value Date/Time   NA 136 11/26/2020 1258   K 3.7 11/26/2020 1258   CL 101 11/26/2020 1258   CO2 26 03/16/2014 1225   GLUCOSE 106 (H) 11/26/2020 1258   BUN 11 11/26/2020 1258   CREATININE 0.80 11/26/2020 1258   CALCIUM 9.3 03/16/2014 1225   PROT 8.2 03/16/2014 1225   ALBUMIN 4.7  03/16/2014 1225   AST 63 (H) 03/16/2014 1225   ALT 60 (H) 03/16/2014 1225   ALKPHOS 99 03/16/2014 1225   BILITOT 1.5 (H) 03/16/2014 1225   GFRNONAA >90 03/16/2014 1225   GFRAA >90 03/16/2014 1225       Component Value Date/Time   WBC 4.4 03/16/2014 1225   RBC 5.57 03/16/2014 1225   HGB 15.0 11/26/2020 1258   HCT 44.0 11/26/2020 1258   PLT 239 03/16/2014 1225   MCV 87.3 03/16/2014 1225   MCH 31.1 03/16/2014 1225   MCHC 35.6 03/16/2014 1225   RDW 12.1 03/16/2014 1225   LYMPHSABS 1.7 03/16/2014 1225   MONOABS 0.3 03/16/2014 1225   EOSABS 0.0 03/16/2014 1225   BASOSABS 0.0 03/16/2014 1225    No results found for: "POCLITH", "LITHIUM"   No results found for: "PHENYTOIN", "PHENOBARB", "VALPROATE", "CBMZ"   .res Assessment: Plan:    Will continue current plan of care since target signs and symptoms are well controlled without any tolerability issues. Continue Cymbalta 60 mg po qd for anxiety and depression.  Continue Ativan 0.5 mg po BID. Will continue 2 week supplies. Continue Propranolol 10 mg 1-2 tabs po BID prn anxiety.  Pt to follow-up in 6 months or sooner if clinically indicated.  Patient advised to contact office with any questions, adverse effects, or acute worsening in signs and symptoms.   Edward Singh was seen today for follow-up.  Diagnoses and all orders for this  visit:  Anxiety -     LORazepam (ATIVAN) 0.5 MG tablet; TAKE 1 TABLET BY MOUTH TWICE A DAY FOR 14 DAYS *NO EARLY FILLS* -     DULoxetine (CYMBALTA) 60 MG capsule; Take 1 capsule (60 mg total) by mouth daily.  Recurrent major depressive disorder, in partial remission (HCC) -     DULoxetine (CYMBALTA) 60 MG capsule; Take 1 capsule (60 mg total) by mouth daily.    Please see After Visit Summary for patient specific instructions.  No future appointments.  No orders of the defined types were placed in this encounter.     -------------------------------

## 2023-05-04 ENCOUNTER — Encounter (HOSPITAL_COMMUNITY): Payer: Self-pay

## 2023-05-04 ENCOUNTER — Ambulatory Visit (HOSPITAL_COMMUNITY)
Admission: EM | Admit: 2023-05-04 | Discharge: 2023-05-04 | Disposition: A | Discharge status: Home / Self Care | Payer: BC Managed Care – PPO | Attending: Emergency Medicine | Admitting: Emergency Medicine

## 2023-05-04 ENCOUNTER — Emergency Department (HOSPITAL_BASED_OUTPATIENT_CLINIC_OR_DEPARTMENT_OTHER)
Admit: 2023-05-04 | Discharge: 2023-05-04 | Disposition: A | Discharge status: Home / Self Care | Payer: BC Managed Care – PPO | Attending: Family Medicine | Admitting: Family Medicine

## 2023-05-04 DIAGNOSIS — N50811 Right testicular pain: Secondary | ICD-10-CM | POA: Insufficient documentation

## 2023-05-04 DIAGNOSIS — N433 Hydrocele, unspecified: Secondary | ICD-10-CM | POA: Diagnosis not present

## 2023-05-04 LAB — POCT URINALYSIS DIP (MANUAL ENTRY)
Bilirubin, UA: NEGATIVE
Blood, UA: NEGATIVE
Glucose, UA: NEGATIVE mg/dL
Ketones, POC UA: NEGATIVE mg/dL
Leukocytes, UA: NEGATIVE
Nitrite, UA: NEGATIVE
Protein Ur, POC: NEGATIVE mg/dL
Spec Grav, UA: 1.025 (ref 1.010–1.025)
Urobilinogen, UA: 1 E.U./dL
pH, UA: 6 (ref 5.0–8.0)

## 2023-05-04 NOTE — ED Provider Notes (Signed)
[facility]    CSN: [id number] Arrival date & time: 05/04/23  1117     History   Chief Complaint Chief Complaint  Patient presents with   Groin Pain    HPI Edward Singh is a 37 y.o. male.  About a week of right testicular pain that radiates into groin. Worse if he accidentally bumps the testicle or with walking. Does not have pain with sitting. Unknown if swelling. Denies urinary symptoms, no blood in urine. Normal BM this morning but last week was constipated. No fever or chills  Took 400 mg ibuprofen that helped pain  Monogamous relationship for 5 years, denies any exposure or concern for STD. No penile discharge, dysuria, itching, lesion/rash.  No prior history of testicular pain or infection, no hx kidney stones    Past Medical History:  Diagnosis Date   Alcoholism (HCC)    Anxiety    Broken arm    Depression    GERD (gastroesophageal reflux disease)    High cholesterol    Hypertension    Panic attacks     Patient Active Problem List   Diagnosis Date Noted   Alcohol dependence (HCC) 03/16/2014   Anxiety 03/12/2014    Past Surgical History:  Procedure Laterality Date   WISDOM TOOTH EXTRACTION         Home Medications    Prior to Admission medications   Medication Sig Start Date End Date Taking? Authorizing Provider  DULoxetine (CYMBALTA) 60 MG capsule Take 1 capsule (60 mg total) by mouth daily. 03/23/23  Yes Corie Chiquito, PMHNP  lisinopril (ZESTRIL) 10 MG tablet Take 10 mg by mouth daily.   Yes [provider]  Multiple Vitamin (MULTIVITAMIN) tablet Take 1 tablet by mouth daily.   Yes [provider]  pantoprazole (PROTONIX) 20 MG tablet TAKE ONE TABLET BY MOUTH DAILY 02/21/23  Yes Corie Chiquito, PMHNP  escitalopram (LEXAPRO) 20 MG tablet 0.5 tablet Orally Once a day    [provider]  LORazepam (ATIVAN) 0.5 MG tablet TAKE 1 TABLET BY MOUTH TWICE A DAY FOR 14 DAYS *NO EARLY FILLS* 04/16/23   Corie Chiquito, PMHNP  propranolol (INDERAL) 10 MG tablet TAKE 1 TO 2 TABLETS BY MOUTH TWICE A DAY AS NEEDED FOR ANXIETY 09/22/22   Corie Chiquito, PMHNP    Family History Family History  Problem Relation Age of Onset   Healthy Mother    Healthy Father     Social History Social History   Tobacco Use   Smoking status: Former   Smokeless tobacco: Former    Types: Associate Professor Use: Never used  Substance Use Topics   Alcohol use: Not Currently   Drug use: No     Allergies   Patient has no known allergies.   Review of Systems Review of Systems As per HPI  Physical Exam Triage Vital Signs ED Triage Vitals [05/04/23 1143]  Enc Vitals Group     BP (!) 147/91     Pulse Rate 71     Resp 18     Temp 97.9 F (36.6 C)     Temp Source Oral     SpO2 97 %     Weight      Height      Head Circumference      Peak Flow      Pain Score      Pain Loc      Pain Edu?  Excl. in GC?    No data found.  Updated Vital Signs BP (!) 147/91 (BP Location: Left Arm)   Pulse 71   Temp 97.9 F (36.6 C) (Oral)   Resp 18   SpO2 97%    Physical Exam Vitals and nursing note reviewed. Exam conducted with a chaperone present [facility]).  Constitutional:      General: He is not in acute distress.    Appearance: Normal appearance. He is not ill-appearing.     Comments: Sitting comfortable on exam table, no acute distress  HENT:     Mouth/Throat:     Pharynx: Oropharynx is clear.  Cardiovascular:     Rate and Rhythm: Normal rate and regular rhythm.     Heart sounds: Normal heart sounds.  Pulmonary:     Effort: Pulmonary effort is normal.     Breath sounds: Normal breath sounds.  Abdominal:     Palpations: Abdomen is soft. There is no mass.     Tenderness: There is no abdominal tenderness. There is no right CVA tenderness or left CVA tenderness.     Hernia: No hernia is present.  Genitourinary:    Penis: Normal.      Testes:        Right: Tenderness present.      Epididymis:     Right: Normal. No mass or tenderness.     Comments: No obvious swelling or enlargement of testicles. Tender at right lateral testicle. No mass or lesion palpated. There is no tenderness over epididymis into groin, no adenopathy. No abdominal pain Musculoskeletal:     Cervical back: Normal range of motion.  Lymphadenopathy:     Lower Body: No right inguinal adenopathy.  Skin:    General: Skin is warm and dry.     Findings: No bruising, erythema or rash.  Neurological:     Mental Status: He is alert and oriented to person, place, and time.     UC Treatments / Results  Labs (all labs ordered are listed, but only abnormal results are displayed) Labs Reviewed  POCT URINALYSIS DIP (MANUAL ENTRY)    EKG  Radiology No results found.  Procedures Procedures   Medications Ordered in UC Medications - No data to display  Initial Impression / Assessment and Plan / UC Course  I have reviewed the triage vital signs and the nursing notes.  Pertinent labs & imaging results that were available during my care of the patient were reviewed by me and considered in my medical decision making (see chart for details).  UA is unremarkable. No blood. Defer STD testing per patient. Consider hydrocele vs varicocele.  Low concern for epididymitis, torsion.  However still recommend testicular ultrasound which is now scheduled for this afternoon.  Reassuring that his pain improved with ibuprofen and I recommend to continue.  Discussed if at any point symptoms become severe or worsen, to not wait for the ultrasound and go right to the hospital.  Patient agreeable to plan.  All questions answered.  Final Clinical Impressions(s) / UC Diagnoses   Final diagnoses:  Right testicular pain     Discharge Instructions      You have an appointment for an ultrasound scheduled at 4:45 pm today. Go to the [facility] imaging center (address below)  You may have a hydrocele or  varicocele.  I have attached some information about both of these.  Neither of them required treatment.  Most of the time they will go away on their own.  Continue taking ibuprofen as needed for pain. However we will still confirm this diagnosis with the ultrasound imaging today.  If your symptoms become severe or worsen before your appointment this afternoon, please go to the hospital.     ED Prescriptions   None    PDMP not reviewed this encounter.   Marlow Baars, [place] 05/04/23 1449

## 2023-05-04 NOTE — ED Triage Notes (Signed)
Pt presents to office for RUQ pain.Pt reports groin pain x 1 week. Pt reports he was constipated last week.

## 2023-05-04 NOTE — Discharge Instructions (Addendum)
You have an appointment for an ultrasound scheduled at 4:45 pm today. Go to the [facility] imaging center (address below)  You may have a hydrocele or varicocele.  I have attached some information about both of these.  Neither of them required treatment.  Most of the time they will go away on their own.  Continue taking ibuprofen as needed for pain. However we will still confirm this diagnosis with the ultrasound imaging today.  If your symptoms become severe or worsen before your appointment this afternoon, please go to the hospital.

## 2023-05-05 ENCOUNTER — Telehealth (HOSPITAL_COMMUNITY): Payer: Self-pay | Admitting: Emergency Medicine

## 2023-05-05 MED ORDER — SULFAMETHOXAZOLE-TRIMETHOPRIM 800-160 MG PO TABS: 1.0000 | ORAL_TABLET | Freq: Two times a day (BID) | ORAL | 0 refills | Status: AC

## 2023-05-05 NOTE — Telephone Encounter (Signed)
Sent Bactrim BID x 10 days for possible orchitis based on results of U/S

## 2023-06-16 ENCOUNTER — Other Ambulatory Visit: Payer: Self-pay | Admitting: Psychiatry

## 2023-06-16 DIAGNOSIS — F419 Anxiety disorder, unspecified: Secondary | ICD-10-CM

## 2023-06-16 DIAGNOSIS — F3341 Major depressive disorder, recurrent, in partial remission: Secondary | ICD-10-CM

## 2023-07-09 ENCOUNTER — Other Ambulatory Visit: Payer: Self-pay | Admitting: Psychiatry

## 2023-07-09 DIAGNOSIS — F419 Anxiety disorder, unspecified: Secondary | ICD-10-CM

## 2023-07-20 ENCOUNTER — Ambulatory Visit (INDEPENDENT_AMBULATORY_CARE_PROVIDER_SITE_OTHER): Payer: BC Managed Care – PPO | Admitting: Psychiatry

## 2023-07-20 ENCOUNTER — Encounter: Payer: Self-pay | Admitting: Psychiatry

## 2023-07-20 DIAGNOSIS — F331 Major depressive disorder, recurrent, moderate: Secondary | ICD-10-CM | POA: Diagnosis not present

## 2023-07-20 DIAGNOSIS — G4700 Insomnia, unspecified: Secondary | ICD-10-CM

## 2023-07-20 DIAGNOSIS — F419 Anxiety disorder, unspecified: Secondary | ICD-10-CM

## 2023-07-20 MED ORDER — CLONAZEPAM 0.5 MG PO TABS
0.5000 mg | ORAL_TABLET | Freq: Two times a day (BID) | ORAL | 0 refills | Status: DC | PRN
Start: 2023-07-20 — End: 2023-07-26

## 2023-07-20 MED ORDER — MIRTAZAPINE 15 MG PO TABS
15.0000 mg | ORAL_TABLET | Freq: Every day | ORAL | 2 refills | Status: DC
Start: 2023-07-20 — End: 2023-08-15

## 2023-07-20 NOTE — Progress Notes (Signed)
Edward Singh [id number] Aug 26, 1986 36 y.o.  Virtual Visit via Telephone Note  I connected with pt on 07/20/23 at  1:45 PM EDT by telephone and verified that I am speaking with the correct person using two identifiers.   I discussed the limitations, risks, security and privacy concerns of performing an evaluation and management service by telephone and the availability of in person appointments. I also discussed with the patient that there may be a patient responsible charge related to this service. The patient expressed understanding and agreed to proceed.   I discussed the assessment and treatment plan with the patient. The patient was provided an opportunity to ask questions and all were answered. The patient agreed with the plan and demonstrated an understanding of the instructions.   The patient was advised to call back or seek an in-person evaluation if the symptoms worsen or if the condition fails to improve as anticipated.  I provided 27 minutes of non-face-to-face time during this encounter.  The patient was located at home.  The provider was located at [facility].   Corie Chiquito, PMHNP   Subjective:   Patient ID:  Edward Singh is a 36 y.o. (DOB [birth year]) male.  Chief Complaint: No chief complaint on file.   HPI Edward Singh presents for follow-up of anxiety, insomnia, and depression. He reports, "my Ativan doesn't seem like it is doing what it's supposed to... I've been in fight or flight mode by the end of the day." He reports, "taking more" Ativan than prescribed over the last 1-2 weeks due to high anxiety. Lorazepam last filled 07/11/23 #28. He reports that he has #4 remaining. He reports that one day he took #4 tabs and a few days took #3. He reports that he was taking Ativan as prescribed up until a few weeks ago. He reports that Lorazepam had been effective and controlling anxiety in situations where he usually had intolerable anxiety, ie.  Traveling.   He reports that he has had some increased work stress and financial stress. He reports that anxiety has been preventing him from being able to work some days. He reports some palpitations and nausea. He reports waking up with nausea and has had n/v the last few mornings. He reports, "I'm nervous to leave the house... just don't feel right." He reports that severe anxiety has caused some depression. Energy and motivation have been low. He reports that his appetite has been decreased the last few days. He reports difficulty with concentration. He has not been fishing and doing things he normally enjoys. He reports difficulty staying asleep and having middle of the night awakenings and middle of the night awakenings. Has not been sleeping well the last 2-3 weeks. Denies SI.   Denies recent ETOH use. He reports, "I thought about it."   Past Psychiatric Medication Trials: Cymbalta- Unable to tolerate 90 mg.  Prozac- Initially effective and then no longer as effective. Felt tired Lexapro- Drowsiness Effexor- Seemed to have helped initially and then started drinking and taking less consistently.  Buspar Abilify- headaches and muscle twitches Propranolol-May have been effective for anxiety Librium- prescribed during detox. Lorazepam Trazodone-Effective. Excessive daytime somnolence Doxepin- Felt groggy the next day Vistaril- ineffective Ambien May have taken Adderall in school. Zzzquil-somewhat helpful  Review of Systems:  Review of Systems  Constitutional:  Positive for diaphoresis.  Cardiovascular:  Positive for palpitations.  Gastrointestinal:  Positive for constipation and diarrhea.  Musculoskeletal:  Negative for gait problem.  Psychiatric/Behavioral:  Please refer to HPI    Medications: I have reviewed the patient's current medications.  Current Outpatient Medications  Medication Sig Dispense Refill   clonazePAM (KLONOPIN) 0.5 MG tablet Take 1 tablet (0.5 mg  total) by mouth 2 (two) times daily as needed for up to 7 days for anxiety. 14 tablet 0   DULoxetine (CYMBALTA) 60 MG capsule TAKE 1 CAPSULE BY MOUTH DAILY 90 capsule 0   esomeprazole (NEXIUM) 20 MG capsule Take 20 mg by mouth daily at 12 noon.     lisinopril (ZESTRIL) 10 MG tablet Take 10 mg by mouth daily.     mirtazapine (REMERON) 15 MG tablet Take 1 tablet (15 mg total) by mouth at bedtime. 30 tablet 2   Multiple Vitamin (MULTIVITAMIN) tablet Take 1 tablet by mouth daily.     Probiotic Product (PROBIOTIC PO) Take by mouth.     LORazepam (ATIVAN) 0.5 MG tablet TAKE 1 TABLET BY MOUTH TWICE A DAY FOR 14 DAYS *NO EARLY FILLS* 28 tablet 5   pantoprazole (PROTONIX) 20 MG tablet TAKE ONE TABLET BY MOUTH DAILY (Patient not taking: Reported on 07/20/2023) 30 tablet 3   propranolol (INDERAL) 10 MG tablet TAKE 1 TO 2 TABLETS BY MOUTH TWICE A DAY AS NEEDED FOR ANXIETY (Patient not taking: Reported on 07/20/2023) 360 tablet 0   No current facility-administered medications for this visit.    Medication Side Effects: None  Allergies: No Known Allergies  Past Medical History:  Diagnosis Date   Alcoholism (HCC)    Anxiety    Broken arm    Depression    GERD (gastroesophageal reflux disease)    High cholesterol    Hypertension    Panic attacks     Family History  Problem Relation Age of Onset   Healthy Mother    Healthy Father     Social History   Socioeconomic History   Marital status: Single    Spouse name: Not on file   Number of children: Not on file   Years of education: Not on file   Highest education level: Not on file  Occupational History   Not on file  Tobacco Use   Smoking status: Former   Smokeless tobacco: Former    Types: Engineer, drilling   Vaping status: Never Used  Substance and Sexual Activity   Alcohol use: Not Currently   Drug use: No   Sexual activity: Yes    Birth control/protection: None  Other Topics Concern   Not on file  Social History Narrative    Not on file   Social Determinants of Health   Financial Resource Strain: Not on file  Food Insecurity: Not on file  Transportation Needs: Not on file  Physical Activity: Not on file  Stress: Not on file  Social Connections: Not on file  Intimate Partner Violence: Not on file    Past Medical History, Surgical history, Social history, and Family history were reviewed and updated as appropriate.   Please see review of systems for further details on the patient's review from today.   Objective:   Physical Exam:  There were no vitals taken for this visit.  Physical Exam Neurological:     Mental Status: He is alert and oriented to person, place, and time.     Cranial Nerves: No dysarthria.  Psychiatric:        Attention and Perception: Attention and perception normal.        Mood and Affect: Mood is anxious and  depressed.        Speech: Speech normal.        Behavior: Behavior is cooperative.        Thought Content: Thought content normal. Thought content is not paranoid or delusional. Thought content does not include homicidal or suicidal ideation. Thought content does not include homicidal or suicidal plan.        Cognition and Memory: Cognition and memory normal.        Judgment: Judgment normal.     Comments: Insight intact     Lab Review:     Component Value Date/Time   NA 136 11/26/2020 1258   K 3.7 11/26/2020 1258   CL 101 11/26/2020 1258   CO2 26 03/16/2014 1225   GLUCOSE 106 (H) 11/26/2020 1258   BUN 11 11/26/2020 1258   CREATININE 0.80 11/26/2020 1258   CALCIUM 9.3 03/16/2014 1225   PROT 8.2 03/16/2014 1225   ALBUMIN 4.7 03/16/2014 1225   AST 63 (H) 03/16/2014 1225   ALT 60 (H) 03/16/2014 1225   ALKPHOS 99 03/16/2014 1225   BILITOT 1.5 (H) 03/16/2014 1225   GFRNONAA >90 03/16/2014 1225   GFRAA >90 03/16/2014 1225       Component Value Date/Time   WBC 4.4 03/16/2014 1225   RBC 5.57 03/16/2014 1225   HGB 15.0 11/26/2020 1258   HCT 44.0 11/26/2020 1258    PLT 239 03/16/2014 1225   MCV 87.3 03/16/2014 1225   MCH 31.1 03/16/2014 1225   MCHC 35.6 03/16/2014 1225   RDW 12.1 03/16/2014 1225   LYMPHSABS 1.7 03/16/2014 1225   MONOABS 0.3 03/16/2014 1225   EOSABS 0.0 03/16/2014 1225   BASOSABS 0.0 03/16/2014 1225    No results found for: "POCLITH", "LITHIUM"   No results found for: "PHENYTOIN", "PHENOBARB", "VALPROATE", "CBMZ"   .res Assessment: Plan:    Discussed stopping Lorazepam due to it no longer being effective. Discussed potential benefits, risks, and side effects of Klonopin and that it may be more helpful due to long half-life. Recommended asking a family member to give him Klonopin one day at a time to prevent over-taking and will also prescribe one week supply that will not be refilled early. Pt verbalized understanding. Will start Klonopin 0.5 mg BID for one week. Recommended taking it BID for 3-5 days, then 1/2-1 tab as needed. Discussed potential benefits, risks, and side effects of Mirtazapine for depression, anxiety, and insomnia. Discussed that Mirtazapine would likely help with n/v and low appetite. Will start Mirtazapine 15 mg po at bedtime.  Continue Cymbalta 60 mg daily for anxiety and depression.  Pt to follow-up with this provider in 1 week or sooner if clinically indicated.  Patient advised to contact office with any questions, adverse effects, or acute worsening in signs and symptoms.   Diagnoses and all orders for this visit:  Anxiety -     mirtazapine (REMERON) 15 MG tablet; Take 1 tablet (15 mg total) by mouth at bedtime. -     clonazePAM (KLONOPIN) 0.5 MG tablet; Take 1 tablet (0.5 mg total) by mouth 2 (two) times daily as needed for up to 7 days for anxiety.  Insomnia, unspecified type -     mirtazapine (REMERON) 15 MG tablet; Take 1 tablet (15 mg total) by mouth at bedtime.  Moderate episode of recurrent major depressive disorder (HCC) -     mirtazapine (REMERON) 15 MG tablet; Take 1 tablet (15 mg total)  by mouth at bedtime.    Please see After  Visit Summary for patient specific instructions.  Future Appointments  Date Time Provider Department Center  07/27/2023 10:00 AM Corie Chiquito, PMHNP [facility] None  09/20/2023  9:00 AM Corie Chiquito, PMHNP [facility] None    No orders of the defined types were placed in this encounter.     -------------------------------

## 2023-07-26 ENCOUNTER — Other Ambulatory Visit: Payer: Self-pay

## 2023-07-26 ENCOUNTER — Telehealth: Payer: Self-pay | Admitting: Psychiatry

## 2023-07-26 DIAGNOSIS — F419 Anxiety disorder, unspecified: Secondary | ICD-10-CM

## 2023-07-26 NOTE — Telephone Encounter (Signed)
We had to cancel Edward Singh's appt for tomorrow.  He is now RS for 10/9.  He needs refill of his Clonazepam.  Send to [id number] - [place], [place] - [address]

## 2023-07-26 NOTE — Telephone Encounter (Signed)
Pended.

## 2023-07-27 ENCOUNTER — Telehealth: Payer: BC Managed Care – PPO | Admitting: Psychiatry

## 2023-07-27 MED ORDER — CLONAZEPAM 0.5 MG PO TABS
0.5000 mg | ORAL_TABLET | Freq: Two times a day (BID) | ORAL | 0 refills | Status: DC | PRN
Start: 2023-07-27 — End: 2023-08-02

## 2023-08-02 ENCOUNTER — Telehealth: Payer: Self-pay | Admitting: Psychiatry

## 2023-08-02 ENCOUNTER — Other Ambulatory Visit: Payer: Self-pay

## 2023-08-02 DIAGNOSIS — F419 Anxiety disorder, unspecified: Secondary | ICD-10-CM

## 2023-08-02 MED ORDER — CLONAZEPAM 0.5 MG PO TABS
0.5000 mg | ORAL_TABLET | Freq: Two times a day (BID) | ORAL | 0 refills | Status: DC | PRN
Start: 2023-08-02 — End: 2023-08-10

## 2023-08-02 NOTE — Telephone Encounter (Signed)
Patient called in for refill on Clonazepam 0.5mg . His last prescription was for one week 9/20 last refill. Ph: [phone] Appt 10/9 [facility]acy [facility] [address] [place], [place]

## 2023-08-02 NOTE — Telephone Encounter (Signed)
Pended.

## 2023-08-03 DIAGNOSIS — I10 Essential (primary) hypertension: Secondary | ICD-10-CM | POA: Diagnosis not present

## 2023-08-03 DIAGNOSIS — F1011 Alcohol abuse, in remission: Secondary | ICD-10-CM | POA: Diagnosis not present

## 2023-08-03 DIAGNOSIS — F411 Generalized anxiety disorder: Secondary | ICD-10-CM | POA: Diagnosis not present

## 2023-08-03 DIAGNOSIS — R7303 Prediabetes: Secondary | ICD-10-CM | POA: Diagnosis not present

## 2023-08-10 ENCOUNTER — Telehealth: Payer: Self-pay | Admitting: Psychiatry

## 2023-08-10 ENCOUNTER — Other Ambulatory Visit: Payer: Self-pay

## 2023-08-10 DIAGNOSIS — F419 Anxiety disorder, unspecified: Secondary | ICD-10-CM

## 2023-08-10 MED ORDER — CLONAZEPAM 0.5 MG PO TABS
0.5000 mg | ORAL_TABLET | Freq: Two times a day (BID) | ORAL | 0 refills | Status: DC | PRN
Start: 2023-08-10 — End: 2023-08-17

## 2023-08-10 NOTE — Telephone Encounter (Signed)
Pt called asking for a refill on his klonopin. [facility] on [address]. Next appt 10/09

## 2023-08-10 NOTE — Telephone Encounter (Signed)
Pended.

## 2023-08-11 ENCOUNTER — Other Ambulatory Visit: Payer: Self-pay | Admitting: Psychiatry

## 2023-08-11 DIAGNOSIS — F419 Anxiety disorder, unspecified: Secondary | ICD-10-CM

## 2023-08-11 DIAGNOSIS — F331 Major depressive disorder, recurrent, moderate: Secondary | ICD-10-CM

## 2023-08-11 DIAGNOSIS — G4700 Insomnia, unspecified: Secondary | ICD-10-CM

## 2023-08-12 NOTE — Telephone Encounter (Signed)
Has appt 10/9

## 2023-08-15 ENCOUNTER — Encounter: Payer: Self-pay | Admitting: Psychiatry

## 2023-08-15 ENCOUNTER — Ambulatory Visit (INDEPENDENT_AMBULATORY_CARE_PROVIDER_SITE_OTHER): Payer: BC Managed Care – PPO | Admitting: Psychiatry

## 2023-08-15 DIAGNOSIS — F3341 Major depressive disorder, recurrent, in partial remission: Secondary | ICD-10-CM

## 2023-08-15 DIAGNOSIS — F419 Anxiety disorder, unspecified: Secondary | ICD-10-CM

## 2023-08-15 MED ORDER — DULOXETINE HCL 60 MG PO CPEP
60.0000 mg | ORAL_CAPSULE | Freq: Every day | ORAL | 0 refills | Status: DC
Start: 2023-08-15 — End: 2023-10-29

## 2023-08-15 MED ORDER — CLONAZEPAM 0.5 MG PO TABS
0.5000 mg | ORAL_TABLET | Freq: Two times a day (BID) | ORAL | 4 refills | Status: DC | PRN
Start: 2023-08-17 — End: 2023-10-29

## 2023-08-15 NOTE — Progress Notes (Signed)
Edward Singh [id number] 30-Jul-1986 37 y.o.  Virtual Visit via Telephone Note  I connected with pt on 08/15/23 at  9:30 AM EDT by telephone and verified that I am speaking with the correct person using two identifiers.   I discussed the limitations, risks, security and privacy concerns of performing an evaluation and management service by telephone and the availability of in person appointments. I also discussed with the patient that there may be a patient responsible charge related to this service. The patient expressed understanding and agreed to proceed.   I discussed the assessment and treatment plan with the patient. The patient was provided an opportunity to ask questions and all were answered. The patient agreed with the plan and demonstrated an understanding of the instructions.   The patient was advised to call back or seek an in-person evaluation if the symptoms worsen or if the condition fails to improve as anticipated.  I provided 10 minutes of non-face-to-face time during this encounter.  The patient was located at work.  The provider was located at [facility].   Corie Chiquito, PMHNP   Subjective:   Patient ID:  Edward Singh is a 37 y.o. (DOB [birth year]) male.  Chief Complaint:  Chief Complaint  Patient presents with   Follow-up    Anxiety, depression, insomnia    HPI Edward Singh presents for follow-up of anxiety, depression, and insomnia. He reports feeling, "a lot better." He reports that Klonopin has been more effective for his anxiety than Lorazepam. Reports anxiety has been controlled. Denies panic attacks. No recent palpitations and nausea. Denies any depression. Sleeping well again. Sleeping throughout the night.  Appetite has been good. Energy and motivation have been improving. Adequate concentration. Denies SI.   He reports that he has not missed any recent work. Work has been going well. Denies ETOH cravings.   Plans to go  fishing this weekend. Enjoying things.   He reports financial stress is improving now that he is not missing work.   He reports that Klonopin prn use varies from 1/2- 2 tabs total.   Klonopin last filled 08/10/23 for one week supply.   Past Psychiatric Medication Trials: Cymbalta- Unable to tolerate 90 mg.  Prozac- Initially effective and then no longer as effective. Felt tired Lexapro- Drowsiness Effexor- Seemed to have helped initially and then started drinking and taking less consistently.  Buspar Abilify- headaches and muscle twitches Propranolol-May have been effective for anxiety Librium- prescribed during detox. Lorazepam Trazodone-Effective. Excessive daytime somnolence Mirtazapine- helped some for stomach issues with anxiety. Doxepin- Felt groggy the next day Vistaril- ineffective Ambien May have taken Adderall in school. Zzzquil-somewhat helpful    Review of Systems:  Review of Systems  Cardiovascular:  Negative for palpitations.  Gastrointestinal:  Negative for nausea.  Musculoskeletal:  Negative for gait problem.  Psychiatric/Behavioral:         Please refer to HPI    Medications: I have reviewed the patient's current medications.  Current Outpatient Medications  Medication Sig Dispense Refill   esomeprazole (NEXIUM) 20 MG capsule Take 20 mg by mouth daily at 12 noon.     lisinopril (ZESTRIL) 10 MG tablet Take 10 mg by mouth daily.     Multiple Vitamin (MULTIVITAMIN) tablet Take 1 tablet by mouth daily.     Probiotic Product (PROBIOTIC PO) Take by mouth.     [START ON 08/17/2023] clonazePAM (KLONOPIN) 0.5 MG tablet Take 1 tablet (0.5 mg total) by mouth 2 (two) times daily as needed  for up to 14 days for anxiety. 28 tablet 4   DULoxetine (CYMBALTA) 60 MG capsule Take 1 capsule (60 mg total) by mouth daily. 90 capsule 0   mirtazapine (REMERON) 15 MG tablet TAKE 1 TABLET BY MOUTH EVERYDAY AT BEDTIME 90 tablet 1   pantoprazole (PROTONIX) 20 MG tablet TAKE ONE  TABLET BY MOUTH DAILY (Patient not taking: Reported on 07/20/2023) 30 tablet 3   propranolol (INDERAL) 10 MG tablet TAKE 1 TO 2 TABLETS BY MOUTH TWICE A DAY AS NEEDED FOR ANXIETY (Patient not taking: Reported on 07/20/2023) 360 tablet 0   No current facility-administered medications for this visit.    Medication Side Effects: None  Allergies: No Known Allergies  Past Medical History:  Diagnosis Date   Alcoholism (HCC)    Anxiety    Broken arm    Depression    GERD (gastroesophageal reflux disease)    High cholesterol    Hypertension    Panic attacks     Family History  Problem Relation Age of Onset   Healthy Mother    Healthy Father     Social History   Socioeconomic History   Marital status: Single    Spouse name: Not on file   Number of children: Not on file   Years of education: Not on file   Highest education level: Not on file  Occupational History   Not on file  Tobacco Use   Smoking status: Former   Smokeless tobacco: Former    Types: Engineer, drilling   Vaping status: Never Used  Substance and Sexual Activity   Alcohol use: Not Currently   Drug use: No   Sexual activity: Yes    Birth control/protection: None  Other Topics Concern   Not on file  Social History Narrative   Not on file   Social Determinants of Health   Financial Resource Strain: Not on file  Food Insecurity: Not on file  Transportation Needs: Not on file  Physical Activity: Not on file  Stress: Not on file  Social Connections: Not on file  Intimate Partner Violence: Not on file    Past Medical History, Surgical history, Social history, and Family history were reviewed and updated as appropriate.   Please see review of systems for further details on the patient's review from today.   Objective:   Physical Exam:  There were no vitals taken for this visit.  Physical Exam Neurological:     Mental Status: He is alert and oriented to person, place, and time.     Cranial Nerves:  No dysarthria.  Psychiatric:        Attention and Perception: Attention and perception normal.        Mood and Affect: Mood normal.        Speech: Speech normal.        Behavior: Behavior is cooperative.        Thought Content: Thought content normal. Thought content is not paranoid or delusional. Thought content does not include homicidal or suicidal ideation. Thought content does not include homicidal or suicidal plan.        Cognition and Memory: Cognition and memory normal.        Judgment: Judgment normal.     Comments: Insight intact     Lab Review:     Component Value Date/Time   NA 136 11/26/2020 1258   K 3.7 11/26/2020 1258   CL 101 11/26/2020 1258   CO2 26 03/16/2014 1225  GLUCOSE 106 (H) 11/26/2020 1258   BUN 11 11/26/2020 1258   CREATININE 0.80 11/26/2020 1258   CALCIUM 9.3 03/16/2014 1225   PROT 8.2 03/16/2014 1225   ALBUMIN 4.7 03/16/2014 1225   AST 63 (H) 03/16/2014 1225   ALT 60 (H) 03/16/2014 1225   ALKPHOS 99 03/16/2014 1225   BILITOT 1.5 (H) 03/16/2014 1225   GFRNONAA >90 03/16/2014 1225   GFRAA >90 03/16/2014 1225       Component Value Date/Time   WBC 4.4 03/16/2014 1225   RBC 5.57 03/16/2014 1225   HGB 15.0 11/26/2020 1258   HCT 44.0 11/26/2020 1258   PLT 239 03/16/2014 1225   MCV 87.3 03/16/2014 1225   MCH 31.1 03/16/2014 1225   MCHC 35.6 03/16/2014 1225   RDW 12.1 03/16/2014 1225   LYMPHSABS 1.7 03/16/2014 1225   MONOABS 0.3 03/16/2014 1225   EOSABS 0.0 03/16/2014 1225   BASOSABS 0.0 03/16/2014 1225    No results found for: "POCLITH", "LITHIUM"   No results found for: "PHENYTOIN", "PHENOBARB", "VALPROATE", "CBMZ"   .res Assessment: Plan:   Continue Cymbalta 60 mg daily for depression and anxiety.  Continue Klonopin 0.5 mg po BID prn anxiety.  Continue Remeron 15 mg po at bedtime prn insomnia and increased anxiety.  Pt to follow-up with this provider in 4 weeks or sooner if clinically indicated.  Patient advised to contact office  with any questions, adverse effects, or acute worsening in signs and symptoms.   Edward Singh was seen today for follow-up.  Diagnoses and all orders for this visit:  Anxiety -     clonazePAM (KLONOPIN) 0.5 MG tablet; Take 1 tablet (0.5 mg total) by mouth 2 (two) times daily as needed for up to 14 days for anxiety. -     DULoxetine (CYMBALTA) 60 MG capsule; Take 1 capsule (60 mg total) by mouth daily.  Recurrent major depressive disorder, in partial remission (HCC) -     DULoxetine (CYMBALTA) 60 MG capsule; Take 1 capsule (60 mg total) by mouth daily.    Please see After Visit Summary for patient specific instructions.  Future Appointments  Date Time Provider Department Center  09/20/2023  9:00 AM Corie Chiquito, PMHNP [facility] None    No orders of the defined types were placed in this encounter.     -------------------------------

## 2023-09-19 ENCOUNTER — Encounter: Payer: Self-pay | Admitting: Psychiatry

## 2023-09-20 ENCOUNTER — Ambulatory Visit: Payer: BC Managed Care – PPO | Admitting: Psychiatry

## 2023-10-09 ENCOUNTER — Ambulatory Visit: Payer: BC Managed Care – PPO | Admitting: Psychiatry

## 2023-10-29 ENCOUNTER — Encounter: Payer: Self-pay | Admitting: Psychiatry

## 2023-10-29 ENCOUNTER — Telehealth (INDEPENDENT_AMBULATORY_CARE_PROVIDER_SITE_OTHER): Payer: BC Managed Care – PPO | Admitting: Psychiatry

## 2023-10-29 DIAGNOSIS — F419 Anxiety disorder, unspecified: Secondary | ICD-10-CM | POA: Diagnosis not present

## 2023-10-29 DIAGNOSIS — F3341 Major depressive disorder, recurrent, in partial remission: Secondary | ICD-10-CM | POA: Diagnosis not present

## 2023-10-29 DIAGNOSIS — G4700 Insomnia, unspecified: Secondary | ICD-10-CM

## 2023-10-29 MED ORDER — CLONAZEPAM 0.5 MG PO TABS: 0.5000 mg | ORAL_TABLET | Freq: Two times a day (BID) | ORAL | 5 refills | Status: AC | PRN

## 2023-10-29 MED ORDER — DULOXETINE HCL 60 MG PO CPEP: 60.0000 mg | ORAL_CAPSULE | Freq: Every day | ORAL | 1 refills | Status: AC

## 2023-10-29 NOTE — Progress Notes (Signed)
EMJAY BARIS [id number] 1986/07/04 37 y.o.  Virtual Visit via Video Note  I connected with pt @ on 10/29/23 at  3:00 PM EST by a video enabled telemedicine application and verified that I am speaking with the correct person using two identifiers.   I discussed the limitations of evaluation and management by telemedicine and the availability of in person appointments. The patient expressed understanding and agreed to proceed.  I discussed the assessment and treatment plan with the patient. The patient was provided an opportunity to ask questions and all were answered. The patient agreed with the plan and demonstrated an understanding of the instructions.   The patient was advised to call back or seek an in-person evaluation if the symptoms worsen or if the condition fails to improve as anticipated.  I provided 14 minutes of non-face-to-face time during this encounter.  The patient was located at home.  The provider was located at [facility].   Corie Chiquito, PMHNP   Subjective:   Patient ID:  Edward Singh is a 37 y.o. (DOB [birth year]) male.  Chief Complaint:  Chief Complaint  Patient presents with   Follow-up    Anxiety, depression, and insomnia    HPI Edward Singh presents for follow-up of anxiety, depression, and insomnia. He denies complaints. He reports that his anxiety has been controlled. Denies recent panic. Denies depression. Energy and motivation have been good. He reports adequate sleep. Appetite has been good. Denies difficulty with concentration. Denies SI.   He has not taken Remeron consistently and will take as needed when GI issues flare.   He reports that he has been working 6 days a week.   Denies any acute stressors.   Klonopin last filled 10/16/23 x 5  Past Psychiatric Medication Trials: Cymbalta- Unable to tolerate 90 mg.  Prozac- Initially effective and then no longer as effective. Felt tired Lexapro- Drowsiness Effexor-  Seemed to have helped initially and then started drinking and taking less consistently.  Buspar Abilify- headaches and muscle twitches Propranolol-May have been effective for anxiety Librium- prescribed during detox. Lorazepam Trazodone-Effective. Excessive daytime somnolence Mirtazapine- helped some for stomach issues with anxiety. Doxepin- Felt groggy the next day Vistaril- ineffective Ambien May have taken Adderall in school. Zzzquil-somewhat helpful  Review of Systems:  Review of Systems  Gastrointestinal: Negative.   Musculoskeletal:  Negative for gait problem.  Neurological:  Negative for headaches.  Psychiatric/Behavioral:         Please refer to HPI    Medications: I have reviewed the patient's current medications.  Current Outpatient Medications  Medication Sig Dispense Refill   esomeprazole (NEXIUM) 20 MG capsule Take 20 mg by mouth daily at 12 noon.     lisinopril (ZESTRIL) 10 MG tablet Take 10 mg by mouth daily.     Multiple Vitamin (MULTIVITAMIN) tablet Take 1 tablet by mouth daily.     clonazePAM (KLONOPIN) 0.5 MG tablet Take 1 tablet (0.5 mg total) by mouth 2 (two) times daily as needed for up to 14 days for anxiety. 28 tablet 5   DULoxetine (CYMBALTA) 60 MG capsule Take 1 capsule (60 mg total) by mouth daily. 90 capsule 1   mirtazapine (REMERON) 15 MG tablet TAKE 1 TABLET BY MOUTH EVERYDAY AT BEDTIME (Patient not taking: Reported on 10/29/2023) 90 tablet 1   Probiotic Product (PROBIOTIC PO) Take by mouth. (Patient not taking: Reported on 10/29/2023)     propranolol (INDERAL) 10 MG tablet TAKE 1 TO 2 TABLETS BY MOUTH TWICE A  DAY AS NEEDED FOR ANXIETY (Patient not taking: Reported on 07/20/2023) 360 tablet 0   No current facility-administered medications for this visit.    Medication Side Effects: None  Allergies: No Known Allergies  Past Medical History:  Diagnosis Date   Alcoholism (HCC)    Anxiety    Broken arm    Depression    GERD (gastroesophageal  reflux disease)    High cholesterol    Hypertension    Panic attacks     Family History  Problem Relation Age of Onset   Healthy Mother    Healthy Father     Social History   Socioeconomic History   Marital status: Single    Spouse name: Not on file   Number of children: Not on file   Years of education: Not on file   Highest education level: Not on file  Occupational History   Not on file  Tobacco Use   Smoking status: Former   Smokeless tobacco: Former    Types: Engineer, drilling   Vaping status: Never Used  Substance and Sexual Activity   Alcohol use: Not Currently   Drug use: No   Sexual activity: Yes    Birth control/protection: None  Other Topics Concern   Not on file  Social History Narrative   Not on file   Social Drivers of Health   Financial Resource Strain: Not on file  Food Insecurity: Not on file  Transportation Needs: Not on file  Physical Activity: Not on file  Stress: Not on file  Social Connections: Not on file  Intimate Partner Violence: Not on file    Past Medical History, Surgical history, Social history, and Family history were reviewed and updated as appropriate.   Please see review of systems for further details on the patient's review from today.   Objective:   Physical Exam:  There were no vitals taken for this visit.  Physical Exam Neurological:     Mental Status: He is alert and oriented to person, place, and time.     Cranial Nerves: No dysarthria.  Psychiatric:        Attention and Perception: Attention and perception normal.        Mood and Affect: Mood normal.        Speech: Speech normal.        Behavior: Behavior is cooperative.        Thought Content: Thought content normal. Thought content is not paranoid or delusional. Thought content does not include homicidal or suicidal ideation. Thought content does not include homicidal or suicidal plan.        Cognition and Memory: Cognition and memory normal.         Judgment: Judgment normal.     Comments: Insight intact     Lab Review:     Component Value Date/Time   NA 136 11/26/2020 1258   K 3.7 11/26/2020 1258   CL 101 11/26/2020 1258   CO2 26 03/16/2014 1225   GLUCOSE 106 (H) 11/26/2020 1258   BUN 11 11/26/2020 1258   CREATININE 0.80 11/26/2020 1258   CALCIUM 9.3 03/16/2014 1225   PROT 8.2 03/16/2014 1225   ALBUMIN 4.7 03/16/2014 1225   AST 63 (H) 03/16/2014 1225   ALT 60 (H) 03/16/2014 1225   ALKPHOS 99 03/16/2014 1225   BILITOT 1.5 (H) 03/16/2014 1225   GFRNONAA >90 03/16/2014 1225   GFRAA >90 03/16/2014 1225       Component Value Date/Time  WBC 4.4 03/16/2014 1225   RBC 5.57 03/16/2014 1225   HGB 15.0 11/26/2020 1258   HCT 44.0 11/26/2020 1258   PLT 239 03/16/2014 1225   MCV 87.3 03/16/2014 1225   MCH 31.1 03/16/2014 1225   MCHC 35.6 03/16/2014 1225   RDW 12.1 03/16/2014 1225   LYMPHSABS 1.7 03/16/2014 1225   MONOABS 0.3 03/16/2014 1225   EOSABS 0.0 03/16/2014 1225   BASOSABS 0.0 03/16/2014 1225    No results found for: "POCLITH", "LITHIUM"   No results found for: "PHENYTOIN", "PHENOBARB", "VALPROATE", "CBMZ"   .res Assessment: Plan:    Will continue current plan of care since target signs and symptoms are well controlled without any tolerability issues. Continue Cymbalta 60 mg daily for depression and anxiety.  Continue Klonopin 0.5 mg po BID prn anxiety.  He reports that he would like to continue Remeron 15 mg at bedtime for anxiety with GI symptoms.  Pt to follow-up in 2-3 months or sooner if clinically indicated.  Patient advised to contact office with any questions, adverse effects, or acute worsening in signs and symptoms.   Edward Singh was seen today for follow-up.  Diagnoses and all orders for this visit:  Anxiety -     clonazePAM (KLONOPIN) 0.5 MG tablet; Take 1 tablet (0.5 mg total) by mouth 2 (two) times daily as needed for up to 14 days for anxiety. -     DULoxetine (CYMBALTA) 60 MG capsule; Take 1  capsule (60 mg total) by mouth daily.  Recurrent major depressive disorder, in partial remission (HCC) -     DULoxetine (CYMBALTA) 60 MG capsule; Take 1 capsule (60 mg total) by mouth daily.  Insomnia, unspecified type     Please see After Visit Summary for patient specific instructions.  No future appointments.  No orders of the defined types were placed in this encounter.     -------------------------------

## 2023-11-01 ENCOUNTER — Other Ambulatory Visit: Payer: Self-pay | Admitting: Psychiatry

## 2023-11-01 DIAGNOSIS — F3341 Major depressive disorder, recurrent, in partial remission: Secondary | ICD-10-CM

## 2023-11-01 DIAGNOSIS — F419 Anxiety disorder, unspecified: Secondary | ICD-10-CM

## 2023-12-20 DIAGNOSIS — F331 Major depressive disorder, recurrent, moderate: Secondary | ICD-10-CM | POA: Diagnosis not present

## 2023-12-20 DIAGNOSIS — G4700 Insomnia, unspecified: Secondary | ICD-10-CM | POA: Diagnosis not present

## 2023-12-20 DIAGNOSIS — F411 Generalized anxiety disorder: Secondary | ICD-10-CM | POA: Diagnosis not present

## 2023-12-20 DIAGNOSIS — F1021 Alcohol dependence, in remission: Secondary | ICD-10-CM | POA: Diagnosis not present

## 2024-03-07 DIAGNOSIS — G4700 Insomnia, unspecified: Secondary | ICD-10-CM | POA: Diagnosis not present

## 2024-03-07 DIAGNOSIS — F1021 Alcohol dependence, in remission: Secondary | ICD-10-CM | POA: Diagnosis not present

## 2024-03-07 DIAGNOSIS — F411 Generalized anxiety disorder: Secondary | ICD-10-CM | POA: Diagnosis not present

## 2024-05-18 ENCOUNTER — Ambulatory Visit

## 2024-05-23 DIAGNOSIS — F1021 Alcohol dependence, in remission: Secondary | ICD-10-CM | POA: Diagnosis not present

## 2024-05-23 DIAGNOSIS — F411 Generalized anxiety disorder: Secondary | ICD-10-CM | POA: Diagnosis not present

## 2024-05-23 DIAGNOSIS — F339 Major depressive disorder, recurrent, unspecified: Secondary | ICD-10-CM | POA: Diagnosis not present

## 2024-05-23 DIAGNOSIS — G4700 Insomnia, unspecified: Secondary | ICD-10-CM | POA: Diagnosis not present

## 2024-06-27 DIAGNOSIS — F1021 Alcohol dependence, in remission: Secondary | ICD-10-CM | POA: Diagnosis not present

## 2024-06-27 DIAGNOSIS — F330 Major depressive disorder, recurrent, mild: Secondary | ICD-10-CM | POA: Diagnosis not present

## 2024-06-27 DIAGNOSIS — F411 Generalized anxiety disorder: Secondary | ICD-10-CM | POA: Diagnosis not present

## 2024-06-27 DIAGNOSIS — G4700 Insomnia, unspecified: Secondary | ICD-10-CM | POA: Diagnosis not present

## 2024-08-21 DIAGNOSIS — F411 Generalized anxiety disorder: Secondary | ICD-10-CM | POA: Diagnosis not present

## 2024-08-21 DIAGNOSIS — F330 Major depressive disorder, recurrent, mild: Secondary | ICD-10-CM | POA: Diagnosis not present

## 2024-08-21 DIAGNOSIS — G4700 Insomnia, unspecified: Secondary | ICD-10-CM | POA: Diagnosis not present

## 2024-08-21 DIAGNOSIS — F1021 Alcohol dependence, in remission: Secondary | ICD-10-CM | POA: Diagnosis not present

## 2024-10-09 DIAGNOSIS — F331 Major depressive disorder, recurrent, moderate: Secondary | ICD-10-CM | POA: Diagnosis not present

## 2024-10-09 DIAGNOSIS — F411 Generalized anxiety disorder: Secondary | ICD-10-CM | POA: Diagnosis not present

## 2024-10-09 DIAGNOSIS — F1021 Alcohol dependence, in remission: Secondary | ICD-10-CM | POA: Diagnosis not present

## 2024-10-09 DIAGNOSIS — G4700 Insomnia, unspecified: Secondary | ICD-10-CM | POA: Diagnosis not present
